# Patient Record
Sex: Female | Born: 1960 | Race: White | Hispanic: No | Marital: Married | Smoking: Never smoker
Health system: Southern US, Community
[De-identification: ages and names within clinical notes are randomized; demographics above are authoritative.]

## PROBLEM LIST (undated history)

## (undated) DIAGNOSIS — I10 Essential (primary) hypertension: Secondary | ICD-10-CM

## (undated) DIAGNOSIS — R3129 Other microscopic hematuria: Secondary | ICD-10-CM

## (undated) DIAGNOSIS — I6529 Occlusion and stenosis of unspecified carotid artery: Secondary | ICD-10-CM

## (undated) DIAGNOSIS — M8580 Other specified disorders of bone density and structure, unspecified site: Secondary | ICD-10-CM

## (undated) HISTORY — DX: Other microscopic hematuria: R31.29

## (undated) HISTORY — PX: DILATION AND CURETTAGE OF UTERUS: SHX78

## (undated) HISTORY — DX: Essential (primary) hypertension: I10

## (undated) HISTORY — DX: Occlusion and stenosis of unspecified carotid artery: I65.29

---
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
## 1997-05-22 ENCOUNTER — Ambulatory Visit (HOSPITAL_COMMUNITY): Admission: RE | Admit: 1997-05-22 | Discharge: 1997-05-22 | Payer: Self-pay | Admitting: Obstetrics and Gynecology

## 1998-02-12 ENCOUNTER — Encounter: Admission: RE | Admit: 1998-02-12 | Discharge: 1998-05-13 | Payer: Self-pay | Admitting: Gynecology

## 1998-04-12 ENCOUNTER — Observation Stay (HOSPITAL_COMMUNITY): Admission: AD | Admit: 1998-04-12 | Discharge: 1998-04-13 | Payer: Self-pay | Admitting: Gynecology

## 1998-04-24 ENCOUNTER — Inpatient Hospital Stay (HOSPITAL_COMMUNITY): Admission: AD | Admit: 1998-04-24 | Discharge: 1998-04-24 | Payer: Self-pay | Admitting: Obstetrics and Gynecology

## 1998-04-29 ENCOUNTER — Inpatient Hospital Stay (HOSPITAL_COMMUNITY): Admission: AD | Admit: 1998-04-29 | Discharge: 1998-05-02 | Payer: Self-pay | Admitting: Gynecology

## 1998-06-08 ENCOUNTER — Ambulatory Visit (HOSPITAL_COMMUNITY): Admission: RE | Admit: 1998-06-08 | Discharge: 1998-06-08 | Payer: Self-pay | Admitting: Family Medicine

## 1998-06-08 ENCOUNTER — Encounter: Payer: Self-pay | Admitting: Family Medicine

## 1998-06-11 ENCOUNTER — Other Ambulatory Visit: Admission: RE | Admit: 1998-06-11 | Discharge: 1998-06-11 | Payer: Self-pay | Admitting: Gynecology

## 1999-06-14 ENCOUNTER — Encounter: Payer: Self-pay | Admitting: Family Medicine

## 1999-06-14 ENCOUNTER — Ambulatory Visit (HOSPITAL_COMMUNITY): Admission: RE | Admit: 1999-06-14 | Discharge: 1999-06-14 | Payer: Self-pay | Admitting: Family Medicine

## 1999-07-13 ENCOUNTER — Other Ambulatory Visit: Admission: RE | Admit: 1999-07-13 | Discharge: 1999-07-13 | Payer: Self-pay | Admitting: Family Medicine

## 1999-12-30 ENCOUNTER — Other Ambulatory Visit: Admission: RE | Admit: 1999-12-30 | Discharge: 1999-12-30 | Payer: Self-pay | Admitting: Gynecology

## 2000-06-14 ENCOUNTER — Encounter: Payer: Self-pay | Admitting: Family Medicine

## 2000-06-14 ENCOUNTER — Ambulatory Visit (HOSPITAL_COMMUNITY): Admission: RE | Admit: 2000-06-14 | Discharge: 2000-06-14 | Payer: Self-pay | Admitting: Family Medicine

## 2000-10-03 ENCOUNTER — Other Ambulatory Visit: Admission: RE | Admit: 2000-10-03 | Discharge: 2000-10-03 | Payer: Self-pay | Admitting: *Deleted

## 2001-02-15 ENCOUNTER — Ambulatory Visit (HOSPITAL_COMMUNITY): Admission: RE | Admit: 2001-02-15 | Discharge: 2001-02-15 | Payer: Self-pay | Admitting: Gastroenterology

## 2001-05-23 ENCOUNTER — Other Ambulatory Visit: Admission: RE | Admit: 2001-05-23 | Discharge: 2001-05-23 | Payer: Self-pay | Admitting: Gynecology

## 2001-06-18 ENCOUNTER — Ambulatory Visit (HOSPITAL_COMMUNITY): Admission: RE | Admit: 2001-06-18 | Discharge: 2001-06-18 | Payer: Self-pay | Admitting: Family Medicine

## 2001-06-18 ENCOUNTER — Encounter: Payer: Self-pay | Admitting: Family Medicine

## 2001-06-20 ENCOUNTER — Encounter: Payer: Self-pay | Admitting: Family Medicine

## 2001-06-20 ENCOUNTER — Encounter: Admission: RE | Admit: 2001-06-20 | Discharge: 2001-06-20 | Payer: Self-pay | Admitting: Family Medicine

## 2002-06-30 ENCOUNTER — Encounter: Payer: Self-pay | Admitting: Family Medicine

## 2002-06-30 ENCOUNTER — Encounter: Admission: RE | Admit: 2002-06-30 | Discharge: 2002-06-30 | Payer: Self-pay | Admitting: Family Medicine

## 2002-12-14 ENCOUNTER — Inpatient Hospital Stay (HOSPITAL_COMMUNITY): Admission: AD | Admit: 2002-12-14 | Discharge: 2002-12-14 | Payer: Self-pay | Admitting: Gynecology

## 2002-12-14 ENCOUNTER — Encounter (INDEPENDENT_AMBULATORY_CARE_PROVIDER_SITE_OTHER): Payer: Self-pay | Admitting: *Deleted

## 2003-01-14 ENCOUNTER — Encounter: Admission: RE | Admit: 2003-01-14 | Discharge: 2003-01-14 | Payer: Self-pay | Admitting: Family Medicine

## 2003-06-15 ENCOUNTER — Inpatient Hospital Stay (HOSPITAL_COMMUNITY): Admission: AD | Admit: 2003-06-15 | Discharge: 2003-06-15 | Payer: Self-pay | Admitting: Gynecology

## 2003-09-11 ENCOUNTER — Other Ambulatory Visit: Admission: RE | Admit: 2003-09-11 | Discharge: 2003-09-11 | Payer: Self-pay | Admitting: Family Medicine

## 2003-10-08 ENCOUNTER — Encounter: Admission: RE | Admit: 2003-10-08 | Discharge: 2003-10-08 | Payer: Self-pay | Admitting: Family Medicine

## 2004-06-16 ENCOUNTER — Other Ambulatory Visit: Admission: RE | Admit: 2004-06-16 | Discharge: 2004-06-16 | Payer: Self-pay | Admitting: Gynecology

## 2004-09-22 ENCOUNTER — Other Ambulatory Visit: Admission: RE | Admit: 2004-09-22 | Discharge: 2004-09-22 | Payer: Self-pay | Admitting: Gynecology

## 2004-10-11 ENCOUNTER — Encounter: Admission: RE | Admit: 2004-10-11 | Discharge: 2004-10-11 | Payer: Self-pay | Admitting: Gynecology

## 2005-04-06 ENCOUNTER — Encounter: Admission: RE | Admit: 2005-04-06 | Discharge: 2005-04-06 | Payer: Self-pay | Admitting: Family Medicine

## 2005-06-23 ENCOUNTER — Other Ambulatory Visit: Admission: RE | Admit: 2005-06-23 | Discharge: 2005-06-23 | Payer: Self-pay | Admitting: Gynecology

## 2005-10-31 ENCOUNTER — Encounter: Admission: RE | Admit: 2005-10-31 | Discharge: 2005-10-31 | Payer: Self-pay | Admitting: Family Medicine

## 2005-11-07 ENCOUNTER — Encounter: Admission: RE | Admit: 2005-11-07 | Discharge: 2005-11-07 | Payer: Self-pay | Admitting: Family Medicine

## 2006-01-30 DIAGNOSIS — I10 Essential (primary) hypertension: Secondary | ICD-10-CM

## 2006-01-30 HISTORY — DX: Essential (primary) hypertension: I10

## 2006-07-12 ENCOUNTER — Other Ambulatory Visit: Admission: RE | Admit: 2006-07-12 | Discharge: 2006-07-12 | Payer: Self-pay | Admitting: Gynecology

## 2006-11-13 ENCOUNTER — Encounter: Admission: RE | Admit: 2006-11-13 | Discharge: 2006-11-13 | Payer: Self-pay | Admitting: Family Medicine

## 2007-07-25 ENCOUNTER — Other Ambulatory Visit: Admission: RE | Admit: 2007-07-25 | Discharge: 2007-07-25 | Payer: Self-pay | Admitting: Gynecology

## 2007-12-10 ENCOUNTER — Encounter: Admission: RE | Admit: 2007-12-10 | Discharge: 2007-12-10 | Payer: Self-pay | Admitting: Family Medicine

## 2008-02-14 ENCOUNTER — Ambulatory Visit: Payer: Self-pay | Admitting: Internal Medicine

## 2008-02-14 DIAGNOSIS — I10 Essential (primary) hypertension: Secondary | ICD-10-CM | POA: Insufficient documentation

## 2008-02-14 DIAGNOSIS — R42 Dizziness and giddiness: Secondary | ICD-10-CM | POA: Insufficient documentation

## 2008-03-09 ENCOUNTER — Ambulatory Visit: Payer: Self-pay | Admitting: Internal Medicine

## 2008-07-27 ENCOUNTER — Other Ambulatory Visit: Admission: RE | Admit: 2008-07-27 | Discharge: 2008-07-27 | Payer: Self-pay | Admitting: Gynecology

## 2008-07-27 ENCOUNTER — Encounter: Payer: Self-pay | Admitting: Women's Health

## 2008-07-27 ENCOUNTER — Ambulatory Visit: Payer: Self-pay | Admitting: Women's Health

## 2008-08-13 DIAGNOSIS — O99810 Abnormal glucose complicating pregnancy: Secondary | ICD-10-CM | POA: Insufficient documentation

## 2008-09-21 ENCOUNTER — Ambulatory Visit: Payer: Self-pay | Admitting: Internal Medicine

## 2008-10-15 ENCOUNTER — Encounter: Payer: Self-pay | Admitting: Internal Medicine

## 2008-10-22 ENCOUNTER — Ambulatory Visit: Payer: Self-pay | Admitting: Internal Medicine

## 2008-10-22 DIAGNOSIS — R319 Hematuria, unspecified: Secondary | ICD-10-CM | POA: Insufficient documentation

## 2008-11-04 ENCOUNTER — Telehealth (INDEPENDENT_AMBULATORY_CARE_PROVIDER_SITE_OTHER): Payer: Self-pay | Admitting: *Deleted

## 2008-11-12 ENCOUNTER — Telehealth (INDEPENDENT_AMBULATORY_CARE_PROVIDER_SITE_OTHER): Payer: Self-pay | Admitting: *Deleted

## 2008-12-30 ENCOUNTER — Encounter: Admission: RE | Admit: 2008-12-30 | Discharge: 2008-12-30 | Payer: Self-pay | Admitting: Family Medicine

## 2009-02-18 ENCOUNTER — Ambulatory Visit: Payer: Self-pay | Admitting: Women's Health

## 2009-02-20 ENCOUNTER — Ambulatory Visit: Payer: Self-pay | Admitting: Family Medicine

## 2009-02-20 DIAGNOSIS — R5381 Other malaise: Secondary | ICD-10-CM | POA: Insufficient documentation

## 2009-02-20 DIAGNOSIS — R5383 Other fatigue: Secondary | ICD-10-CM

## 2009-02-21 LAB — CONVERTED CEMR LAB: TSH: 1.356 microintl units/mL (ref 0.350–4.500)

## 2009-08-04 ENCOUNTER — Ambulatory Visit: Payer: Self-pay | Admitting: Women's Health

## 2009-08-04 ENCOUNTER — Other Ambulatory Visit: Admission: RE | Admit: 2009-08-04 | Discharge: 2009-08-04 | Payer: Self-pay | Admitting: Obstetrics and Gynecology

## 2009-09-23 ENCOUNTER — Ambulatory Visit: Payer: Self-pay | Admitting: Cardiology

## 2009-12-31 ENCOUNTER — Encounter: Admission: RE | Admit: 2009-12-31 | Discharge: 2009-12-31 | Payer: Self-pay | Admitting: Gynecology

## 2010-02-20 ENCOUNTER — Encounter: Payer: Self-pay | Admitting: Family Medicine

## 2010-02-27 LAB — CONVERTED CEMR LAB
BUN: 14 mg/dL (ref 6–23)
Basophils Absolute: 0 10*3/uL (ref 0.0–0.1)
Basophils Relative: 0.6 % (ref 0.0–3.0)
Bilirubin Urine: NEGATIVE
CO2: 33 meq/L — ABNORMAL HIGH (ref 19–32)
Calcium: 9.7 mg/dL (ref 8.4–10.5)
Chloride: 103 meq/L (ref 96–112)
Creatinine, Ser: 0.9 mg/dL (ref 0.4–1.2)
Eosinophils Absolute: 0.1 10*3/uL (ref 0.0–0.7)
Eosinophils Relative: 2.1 % (ref 0.0–5.0)
GFR calc non Af Amer: 70.52 mL/min (ref >60)
Glucose, Bld: 93 mg/dL (ref 70–99)
HCT: 36.6 % (ref 36.0–46.0)
Hemoglobin: 12.4 g/dL (ref 12.0–15.0)
Ketones, ur: NEGATIVE mg/dL
Lymphocytes Relative: 34.8 % (ref 12.0–46.0)
Lymphs Abs: 2.4 10*3/uL (ref 0.7–4.0)
MCHC: 33.9 g/dL (ref 30.0–36.0)
MCV: 91.5 fL (ref 78.0–100.0)
Monocytes Absolute: 0.5 10*3/uL (ref 0.1–1.0)
Monocytes Relative: 6.8 % (ref 3.0–12.0)
Neutro Abs: 3.9 10*3/uL (ref 1.4–7.7)
Neutrophils Relative %: 55.7 % (ref 43.0–77.0)
Nitrite: NEGATIVE
Platelets: 179 10*3/uL (ref 150.0–400.0)
Potassium: 3.7 meq/L (ref 3.5–5.1)
RBC: 4 M/uL (ref 3.87–5.11)
RDW: 12.2 % (ref 11.5–14.6)
Sodium: 141 meq/L (ref 135–145)
Specific Gravity, Urine: <=1.005 (ref 1.000–1.030)
TSH: 1.13 microintl units/mL (ref 0.35–5.50)
Total Protein, Urine: NEGATIVE mg/dL
Urine Glucose: NEGATIVE mg/dL
Urobilinogen, UA: 0.2 (ref 0.0–1.0)
WBC: 6.9 10*3/uL (ref 4.5–10.5)
pH: 7 (ref 5.0–8.0)

## 2010-03-01 NOTE — Assessment & Plan Note (Signed)
Summary: Inc BP, Shaky, nausea, dixxy x 1 mth rm 2   Vital Signs:  Patient Profile:   50 Years Old Female CC:       Inc BP, nausea, dizzy, shaky x 2-3 dys Height:     66 inches Weight:      134 pounds O2 Sat:      100 % O2 treatment:    Room Air Temp:     97.1 degrees F oral Pulse rate:   72 / minute Pulse (ortho):   87 / minute Pulse rhythm:   regular Resp:     16 per minute BP sitting:   118 / 84  (left arm) BP standing:   113 / 77 Cuff size:   regular  Vitals Entered By: Areta Haber CMA (February 20, 2009 1:03 PM)                 Serial Vital Signs/Assessments:  Time      Position  BP       Pulse  Resp  Temp     By           Lying RA  122/76   73                    [facility] CMA           Sitting   120/79   78                    Areta Haber CMA           Standing  113/77   87                    Areta Haber CMA   Current Allergies: ! CODEINE     History of Present Illness Chief Complaint:  Inc BP, nausea, dizzy, shaky x 2-3 dys History of Present Illness: Subjective:  Patient complains of one week history of vague recurring dizziness with movement, fatigue,  and not feeling quite well.  She has had occasional brief nausea without vomiting.  No abdominal pain.  She has had increased constipation for about 3 weeks.  At bedtime, her chest feels somewhat tight but not painful, and she sometimes feels that her heart is beating more rapidly at bedtime.  She has no chest symptoms with activity.  No shortness of breath.   No fevers, chills, and sweats.  Menstrual periods are short and irregular. She checked her BP this morning and recorded 96/65, although it is normally higher than that. She has had negative screening colonoscopy. Recent CBC and CMP by her cardiologist were normal.  Current Problems: FATIGUE (ICD-780.79) HEMATURIA UNSPECIFIED (ICD-599.70) PRE-ECLAMPSIA, HX OF (ICD-642.40) GESTATIONAL DIABETES (ICD-648.80) HYPERTENSION, BENIGN  (ICD-401.1) DIZZINESS (ICD-780.4)   Current Meds ASPIRIN 81 MG  TABS (ASPIRIN) 1 tab once daily MULTIVITAMINS   TABS (MULTIPLE VITAMIN) 1 tab once daily FISH OIL   OIL (FISH OIL) 2 tabs once daily VITAMIN D 1000 UNIT  TABS (CHOLECALCIFEROL) 2 tabs once daily MAGNESIUM 500 MG TABS (MAGNESIUM) otc 1 tab once daily PINDOLOL 5 MG TABS (PINDOLOL) 1 tab two times a day LISINOPRIL 10 MG TABS (LISINOPRIL) 1 tab by mouth once daily  REVIEW OF SYSTEMS Constitutional Symptoms      Denies fever, chills, night sweats, weight loss, weight gain, and fatigue.  Eyes       Denies change in vision, eye pain, eye discharge, glasses, contact lenses, and eye surgery. Ear/Nose/Throat/Mouth  Denies hearing loss/aids, change in hearing, ear pain, ear discharge, dizziness, frequent runny nose, frequent nose bleeds, sinus problems, sore throat, hoarseness, and tooth pain or bleeding.  Respiratory       Denies dry cough, productive cough, wheezing, shortness of breath, asthma, bronchitis, and emphysema/COPD.  Cardiovascular       Denies murmurs, chest pain, and tires easily with exhertion.    Gastrointestinal       Complains of nausea/vomiting.      Denies stomach pain, diarrhea, constipation, blood in bowel movements, and indigestion. Genitourniary       Denies painful urination, kidney stones, and loss of urinary control. Neurological       Denies paralysis, seizures, and fainting/blackouts.      Comments: dizzy Musculoskeletal       Denies muscle pain, joint pain, joint stiffness, decreased range of motion, redness, swelling, muscle weakness, and gout.  Skin       Denies bruising, unusual mles/lumps or sores, and hair/skin or nail changes.  Psych       Denies mood changes, temper/anger issues, anxiety/stress, speech problems, depression, and sleep problems. Other Comments: pt states that she has been having symptoms for past month. Pt states that she has advise PCP of symptoms.    Past  History:  Past Medical History: Last updated: 08/13/2008 PRE-ECLAMPSIA, HX OF (ICD-642.40) GESTATIONAL DIABETES (ICD-648.80) HYPERTENSION, BENIGN (ICD-401.1) DIZZINESS (ICD-780.4)    Past Surgical History: Last updated: 02/14/2008 Bx of skin lesions  Family History: Last updated: 02/14/2008 Dad, MI in 6s (pipe smoker) Sister MI 29 (stent; smoker/obese) Mom  HTN, TIA Colon Ca grandmother  Social History: Last updated: 02/14/2008 Married. 2kids No tobacco 1-2 glasses wine/night   Objective:  Appearance:  Patient appears healthy, stated age, and in no acute distress  Eyes:  Pupils are equal, round, and reactive to light and accomdation.  Extraocular movement is intact.  Conjunctivae are not inflamed.  Ears:  Canals normal.  Tympanic membranes normal.   Nose:  Normal Pharynx:  Normal  Neck:  Supple.  No adenopathy is present.  No thyromegaly is present.  Carotids have normal upstrokes Lungs:  Clear to auscultation.  Breath sounds are equal.  Heart:  Regular rate and rhythm without murmurs, rubs, or gallops.  Rate 80 Abdomen:  Nontender without masses or hepatosplenomegaly.  Bowel sounds are present.  No CVA or flank tenderness.  Extremities:  No edema.  Pedal pulses are full and equal.  Measurements of standing, sitting, supine BP and pulse show no abnormal orthostatic changes Assessment New Problems: FATIGUE (ICD-780.79)  UNREMARKABLE PHYSICAL EXAM TODAY.  ? ANXIETY   With recent fatigue and constipation, rule out hypothyroid  Plan New Orders: T-TSH [[id number]] New Patient Level III [99203] Planning Comments:   Check TSH.   Follow-up with PCP   The patient and/or caregiver has been counseled thoroughly with regard to medications prescribed including dosage, schedule, interactions, rationale for use, and possible side effects and they verbalize understanding.  Diagnoses and expected course of recovery discussed and will return if not improved as expected or if  the condition worsens. Patient and/or caregiver verbalized understanding.

## 2010-03-15 ENCOUNTER — Ambulatory Visit (INDEPENDENT_AMBULATORY_CARE_PROVIDER_SITE_OTHER): Payer: BC Managed Care – PPO | Admitting: *Deleted

## 2010-03-15 DIAGNOSIS — I10 Essential (primary) hypertension: Secondary | ICD-10-CM

## 2010-04-05 ENCOUNTER — Ambulatory Visit: Payer: BC Managed Care – PPO | Admitting: Nurse Practitioner

## 2010-06-14 NOTE — Assessment & Plan Note (Signed)
[facility]                            CARDIOLOGY OFFICE NOTE   NAME:Studer, NARDOS PUTNAM                        MRN:          [id number]  DATE:03/09/2008                            DOB:          January 02, 1961    IDENTIFICATION:  Ms. Rovira is a 50 year old who I saw back on February 14, 2008.  She has a history of hypertension and dizziness.  When I saw  her last, I though she had some evidence of orthostasis on examination  consistent with autonomic dysfunction.  I switched her medicines and put  her on pindolol 5 mg b.i.d.   On talking to the patient, she said the first couple weeks were rough.  She felt tired, little shaky, but since then has gotten better.  Her  blood pressures at home have been in the 115-160 range.  Pulses have  been recorded in the 70s.   CURRENT MEDICINES:  1. Calcium 600 daily.  2. Fish oil 1 g daily.  3. Multivitamin.  4. Vitamin D.  5. Pindolol 5 b.i.d.   PHYSICAL EXAMINATION:  GENERAL:  The patient in no acute distress.  VITAL SIGNS:  Blood pressure on arrival 116/70; lying 122/81, pulse 76;  sitting 126/77, pulse 76, slightly lightheaded; standing at 0 minutes,  125/75, pulse 85, slightly dizzy; at 2 minutes, 139/88, pulse 80, at 5  minutes, 130/86, pulse 79.  The patient feeling better.  LUNGS:  Clear.  CARDIAC:  Regular rate and rhythm.  S1, S2.  No S3, no murmurs.  ABDOMEN:  Benign.  EXTREMITIES:  No edema.   IMPRESSION:  1. Blood pressure improved.  No evidence of peripheral pulling.  Blood      pressure staying fairly steady.  I would continue her on this      regimen.  I will set to see her in July, talked her about staying      active, eating frequent small meals, having salt in moderation, and      following her blood pressure 1-2 times per week.  If she has any      problems, she can call, otherwise I will set to see her as noted.  2. Health care maintenance.  Again, we will need to look back at her  lipids.  She has a strong family history with her father and      sister.  It may indeed be worthwhile getting a LipoMed panel to      evaluate her particle number.  It looks like her last LDL was 119.      HDL looked greater than 76.     Pricilla Riffle, MD, [facility]  Electronically Signed   PVR/MedQ  DD: 03/09/2008  DT: 03/10/2008  Job #: [id number]

## 2010-06-14 NOTE — Assessment & Plan Note (Signed)
[facility]                            CARDIOLOGY OFFICE NOTE   NAME:Oloughlin, DAJON ROWE                        MRN:          [id number]  DATE:02/14/2008                            DOB:          10-Feb-1960    IDENTIFICATION:  Ms. Hefner is a 50 year old woman who was referred from  the [name] for evaluation of hypertension and dizziness.   HISTORY OF PRESENT ILLNESS:  On talking to the patient today, she had  preeclampsia with her pregnancy back 9 years ago.  Since that time, she  has had blood pressures that were noted to be high in clinic visits and  felt to be white coat.  She does not remember the values.   She was seen in the [facility] on January 21, 2008, and her blood  pressure was quite elevated at 172/110.  She was given clonidine orally  and sent home with Benicar 20 daily.  At home since that time on the  Benicar, blood pressures have ranged from 130s-150s/70s-90s.   The patient does note that she has had spells of dizziness.  The spells  even came on before pregnancy.  She also has a history of hypoglycemia  that was documented.  She denies syncope.  The spells can come and go.  She says she feels the best when she has been up for a walk, otherwise  after a walk her head feels clear.  She feels right otherwise.  It just  is in right.  She feels a little foggy like she just woke up.  She tries  to walk 3 times a day, even run a little.   She denies discomfort in her chest.  No shortness of breath.   ALLERGIES:  CODEINE.   CURRENT MEDICATIONS:  1. Benicar 20 mg at bedtime.  2. Calcium daily 600.  3. Fish oil 1 g daily.  4. Multivitamin daily.  5. Vitamin D daily 1000 units.   PAST MEDICAL HISTORY:  Hypertension, history of preeclampsia.   SOCIAL HISTORY:  The patient is married, stays at home, does not smoke,  drinks 1-2 glasses of wine per day.   FAMILY HISTORY:  Mother had transient ischemic attack in her 69s.  Father had  an MI in his 18s, smokes pipes, one sister had an MI with a  stent placed at age 52 (obese and a smoker).  Hypertension in her mom,  colon cancer in her grandmother.   REVIEW OF SYSTEMS:  All systems reviewed negative to the above problem  except as noted above.  Does note some constipation.   PHYSICAL EXAMINATION:  GENERAL:  On exam, the patient is in no acute  distress.  VITAL SIGNS:  Blood pressure on arrival 154/94, pulse 70, lying blood  pressure 147/89, pulse 73.  The patient asymptomatic, sitting 153/88,  pulse 76, asymptomatic, standing at 0 minutes is 145/97, pulse 84,  asymptomatic; at 2 minutes, 154/98, pulse 92, asymptomatic; and at 5  minutes, 124/95, pulse 100, asymptomatic.  HEENT:  Normocephalic, atraumatic, EOMI, PERL.  Mucous membranes are  moist.  NECK:  JVP is normal.  No thyromegaly or bruits.  LUNGS:  Clear without rales or wheezes.  CARDIAC:  Regular rate and rhythm, S1 and S2.  No S3, S4, or murmurs.  No clicks.  PMI not displaced.  ABDOMEN:  Supple, nontender.  Normal bowel sounds.  No hepatomegaly.  No  masses.  EXTREMITIES:  Good distal pulses throughout, equal onset throughout.  No  lower extremity edema.   A 12-lead EKG here shows normal sinus rhythm 71 beats per minute.  Nonspecific IVCD.   IMPRESSION:  It is interesting Ms. Delaluz has both evidence of  hypertension and some orthostatic intolerance, which may indeed explain  her dizziness, feeling foggy, may even explain some of the posterior  achiness in her neck.   What I would recommend is that we switch her to pindolol 5 mg b.i.d.  She should keep the Benicar and I have given her some samples of the  Avapro for pressure still high greater than 160/100.  She can take half  of this.  I would like to see her back in 4 weeks to reevaluate.   Again, staying active is important, watching to make sure she gets  adequate fluid intake with noncaffeinated drinks (she only drinks 2 cups  per coffee early  in the morning).  Small meals throughout the day would  also be important.   Health care maintenance.  Last lipid panel done was in 2008.  LDL was  119, HDL was very good at looks like 76 at least (fax is hard to read).  With her family history, I think consideration for a LipoMed panel to  really see what her particle numbers look like since both her father and  sister had artery disease.   I will be in touch with the patient.  I will see the patient in 1 month.  She will call if she has worsening symptoms.     Pricilla Riffle, MD, [facility]  Electronically Signed    PVR/MedQ  DD: 02/14/2008  DT: 02/15/2008  Job #: [id number]

## 2010-06-17 NOTE — Op Note (Signed)
[facility]  Patient:    Brittany Graham, Brittany Graham Visit Number: [id number] MRN: [id number]          Service Type: END Location: ENDO Attending Physician:  Rich Brave Dictated by:   Florencia Reasons, M.D. Proc. Date: 02/15/01 Admit Date:  02/15/2001 Discharge Date: 02/15/2001   CC:         Stacie Acres. Cliffton Asters, M.D.   Operative Report  PROCEDURE:  Colonoscopy.  INDICATION:  Family history of colon cancer in her maternal grandmother and colon polyps on her mothers side of the family.  FINDINGS:  Normal exam to the cecum.  DESCRIPTION OF PROCEDURE:  The nature, purpose, and risks of the procedure were familiar to the patient from prior examination. She provided written consent.  SEDATION:  Fentanyl 100 mcg and Versed 12 mg IV without arrhythmias or desaturation.  The Olympus adjustable tension pediatric video colonoscope was used for this procedure but we encountered moderate difficulty with looping, suggesting that in the future, we would do better to use an adult scope. With the patient in the _______ position and some external abdominal compression we were able to reach the base of the cecum. Pullback was then performed. The quality of the prep was excellent and it was felt that all areas were well seen.  This was a normal examination. No polyps, cancer, colitis, vascular malformations or diverticulosis were noted. In retroflexion the rectum was normal. No biopsies were obtained. The patient tolerated the procedure well and there were no apparent complications.  IMPRESSION:  Normal colonoscopy.  PLAN:  Followup colonoscopy in five years in view of the family history. Dictated by:   Florencia Reasons, M.D. Attending Physician:  Rich Brave DD:  02/15/01 TD:  02/17/01 Job: [id number]

## 2010-07-09 ENCOUNTER — Emergency Department (HOSPITAL_BASED_OUTPATIENT_CLINIC_OR_DEPARTMENT_OTHER)
Admission: EM | Admit: 2010-07-09 | Discharge: 2010-07-10 | Disposition: A | Discharge status: Home / Self Care | Payer: BC Managed Care – PPO | Attending: Emergency Medicine | Admitting: Emergency Medicine

## 2010-07-09 DIAGNOSIS — I491 Atrial premature depolarization: Secondary | ICD-10-CM | POA: Insufficient documentation

## 2010-07-09 DIAGNOSIS — R002 Palpitations: Secondary | ICD-10-CM | POA: Insufficient documentation

## 2010-07-09 DIAGNOSIS — I10 Essential (primary) hypertension: Secondary | ICD-10-CM | POA: Insufficient documentation

## 2010-08-08 ENCOUNTER — Other Ambulatory Visit (HOSPITAL_COMMUNITY)
Admission: RE | Admit: 2010-08-08 | Discharge: 2010-08-08 | Disposition: A | Discharge status: Home / Self Care | Payer: BC Managed Care – PPO | Source: Ambulatory Visit | Attending: Gynecology | Admitting: Gynecology

## 2010-08-08 ENCOUNTER — Other Ambulatory Visit: Payer: Self-pay | Admitting: Women's Health

## 2010-08-08 ENCOUNTER — Encounter (INDEPENDENT_AMBULATORY_CARE_PROVIDER_SITE_OTHER): Payer: BC Managed Care – PPO | Admitting: Women's Health

## 2010-08-08 DIAGNOSIS — Z01419 Encounter for gynecological examination (general) (routine) without abnormal findings: Secondary | ICD-10-CM

## 2010-08-08 DIAGNOSIS — Z124 Encounter for screening for malignant neoplasm of cervix: Secondary | ICD-10-CM | POA: Insufficient documentation

## 2010-08-08 DIAGNOSIS — Z833 Family history of diabetes mellitus: Secondary | ICD-10-CM

## 2010-08-08 DIAGNOSIS — Z13220 Encounter for screening for lipoid disorders: Secondary | ICD-10-CM

## 2010-11-30 ENCOUNTER — Other Ambulatory Visit: Payer: Self-pay | Admitting: Family Medicine

## 2010-11-30 DIAGNOSIS — Z1231 Encounter for screening mammogram for malignant neoplasm of breast: Secondary | ICD-10-CM

## 2010-12-07 ENCOUNTER — Ambulatory Visit (INDEPENDENT_AMBULATORY_CARE_PROVIDER_SITE_OTHER): Payer: BC Managed Care – PPO | Admitting: Women's Health

## 2010-12-07 ENCOUNTER — Encounter: Payer: Self-pay | Admitting: Women's Health

## 2010-12-07 VITALS — BP 130/80

## 2010-12-07 DIAGNOSIS — N912 Amenorrhea, unspecified: Secondary | ICD-10-CM

## 2010-12-07 NOTE — Progress Notes (Signed)
  Presents with complaint of not feeling as well as she has in the past. States feels somewhat jittery, not as clear thinking , occasional hot flushes, amenorrheic since August, monthly cycle prior. Hypertensive on medications per cardiologist. Has been hypertensive since 08, healthy lifestyle with exercise, healthy diet and stable weight. BP today 130/80.   Appears well. Menopause reviewed. Will check TSH and FSH, triage based on results. HRT discussed, and WHI study reviewed. Declines meds at this time.

## 2011-01-06 ENCOUNTER — Ambulatory Visit: Payer: BC Managed Care – PPO

## 2011-01-13 ENCOUNTER — Ambulatory Visit
Admission: RE | Admit: 2011-01-13 | Discharge: 2011-01-13 | Disposition: A | Discharge status: Home / Self Care | Payer: BC Managed Care – PPO | Source: Ambulatory Visit | Attending: Family Medicine | Admitting: Family Medicine

## 2011-01-13 DIAGNOSIS — Z1231 Encounter for screening mammogram for malignant neoplasm of breast: Secondary | ICD-10-CM

## 2011-08-16 ENCOUNTER — Encounter: Payer: Self-pay | Admitting: Women's Health

## 2011-08-16 ENCOUNTER — Ambulatory Visit (INDEPENDENT_AMBULATORY_CARE_PROVIDER_SITE_OTHER): Payer: BC Managed Care – PPO | Admitting: Women's Health

## 2011-08-16 VITALS — BP 118/76 | Ht 66.0 in | Wt 133.0 lb

## 2011-08-16 DIAGNOSIS — E079 Disorder of thyroid, unspecified: Secondary | ICD-10-CM

## 2011-08-16 DIAGNOSIS — Z13220 Encounter for screening for lipoid disorders: Secondary | ICD-10-CM

## 2011-08-16 DIAGNOSIS — Z01419 Encounter for gynecological examination (general) (routine) without abnormal findings: Secondary | ICD-10-CM

## 2011-08-16 DIAGNOSIS — Z780 Asymptomatic menopausal state: Secondary | ICD-10-CM

## 2011-08-16 LAB — CBC WITH DIFFERENTIAL/PLATELET
Eosinophils Relative: 2 % (ref 0–5)
HCT: 39.2 % (ref 36.0–46.0)
Hemoglobin: 13.1 g/dL (ref 12.0–15.0)
Lymphocytes Relative: 37 % (ref 12–46)
Lymphs Abs: 1.8 10*3/uL (ref 0.7–4.0)
MCV: 88.7 fL (ref 78.0–100.0)
Monocytes Absolute: 0.4 10*3/uL (ref 0.1–1.0)
Monocytes Relative: 7 % (ref 3–12)
Neutro Abs: 2.6 10*3/uL (ref 1.7–7.7)
RBC: 4.42 MIL/uL (ref 3.87–5.11)
WBC: 4.9 10*3/uL (ref 4.0–10.5)

## 2011-08-16 LAB — COMPREHENSIVE METABOLIC PANEL
ALT: 10 U/L (ref 0–35)
AST: 21 U/L (ref 0–37)
Albumin: 4.4 g/dL (ref 3.5–5.2)
Alkaline Phosphatase: 39 U/L (ref 39–117)
BUN: 10 mg/dL (ref 6–23)
CO2: 30 mEq/L (ref 19–32)
Calcium: 9.6 mg/dL (ref 8.4–10.5)
Chloride: 102 mEq/L (ref 96–112)
Creat: 0.8 mg/dL (ref 0.50–1.10)
Glucose, Bld: 78 mg/dL (ref 70–99)
Potassium: 3.9 mEq/L (ref 3.5–5.3)
Sodium: 139 mEq/L (ref 135–145)
Total Bilirubin: 0.9 mg/dL (ref 0.3–1.2)
Total Protein: 6.8 g/dL (ref 6.0–8.3)

## 2011-08-16 LAB — LIPID PANEL
Cholesterol: 191 mg/dL (ref 0–200)
VLDL: 21 mg/dL (ref 0–40)

## 2011-08-16 NOTE — Progress Notes (Addendum)
Brittany Graham 01-31-1961 [id number]    History:    The patient presents for annual exam.  Monthly cycle for 2-3 days until September 2012, and then none until March  2012. History of normal Paps and mammograms. Saw primary care last week and requested labs to be done today and faxed to her office. History of hypertension (08) on bystolic and lisinopril. Negative colonoscopy in 08 and 2013. Maternal grandmother and 2 sisters with colon cancer history.  Past medical history, past surgical history, family history and social history were all reviewed and documented in the EPIC chart. Stay-at-home mom. Daughter Maralyn Sago 20, adopted daughter Corrie Dandy 6.   ROS:  A  ROS was performed and pertinent positives and negatives are included in the history.  Exam:  Filed Vitals:   08/16/11 0910  BP: 118/76    General appearance:  Normal Head/Neck:  Normal, without cervical or supraclavicular adenopathy. Thyroid:  Symmetrical, normal in size, without palpable masses or nodularity. Respiratory  Effort:  Normal  Auscultation:  Clear without wheezing or rhonchi Cardiovascular  Auscultation:  Regular rate, without rubs, murmurs or gallops  Edema/varicosities:  Not grossly evident Abdominal  Soft,nontender, without masses, guarding or rebound.  Liver/spleen:  No organomegaly noted  Hernia:  None appreciated  Skin  Inspection:  Grossly normal  Palpation:  Grossly normal Neurologic/psychiatric  Orientation:  Normal with appropriate conversation.  Mood/affect:  Normal  Genitourinary    Breasts: Examined lying and sitting.     Right: Without masses, retractions, discharge or axillary adenopathy.     Left: Without masses, retractions, discharge or axillary adenopathy.   Inguinal/mons:  Normal without inguinal adenopathy  External genitalia:  Normal  BUS/Urethra/Skene's glands:  Normal  Bladder:  Normal  Vagina:  Normal  Cervix:  Normal  Uterus:   normal in size, shape and contour.  Midline and  mobile  Adnexa/parametria:     Rt: Without masses or tenderness.   Lt: Without masses or tenderness.  Anus and perineum: Normal  Digital rectal exam: Normal sphincter tone without palpated masses or tenderness  Assessment/Plan:  51 y.o. MWF G5 P1 for annual exam with no complaints.  Perimenopause Hypertension-primary care meds  Plan: CBC, TSH, lipid panel, FSH, comprehensive metabolic panel and vitamin D. No Pap history of normal Paps. SBE's, continue annual mammogram, calcium rich diet, vitamin D 2000 daily encouraged. Bone density this year or next. Reviewed if FSH elevated - menopause. Denies problematic menopausal symptoms other than states is moody at times.   Harrington Challenger [facility], 4:41 PM 08/16/2011

## 2011-08-16 NOTE — Patient Instructions (Signed)

## 2011-08-17 LAB — URINALYSIS W MICROSCOPIC + REFLEX CULTURE
Glucose, UA: NEGATIVE mg/dL
Leukocytes, UA: NEGATIVE
Nitrite: NEGATIVE
Protein, ur: NEGATIVE mg/dL
Squamous Epithelial / LPF: NONE SEEN
Urobilinogen, UA: 0.2 mg/dL (ref 0.0–1.0)

## 2011-08-17 LAB — TSH: TSH: 1.375 u[IU]/mL (ref 0.350–4.500)

## 2011-08-17 LAB — VITAMIN D 25 HYDROXY (VIT D DEFICIENCY, FRACTURES): Vit D, 25-Hydroxy: 71 ng/mL (ref 30–89)

## 2011-12-18 ENCOUNTER — Other Ambulatory Visit: Payer: Self-pay | Admitting: Family Medicine

## 2011-12-18 DIAGNOSIS — Z1231 Encounter for screening mammogram for malignant neoplasm of breast: Secondary | ICD-10-CM

## 2012-02-15 ENCOUNTER — Ambulatory Visit
Admission: RE | Admit: 2012-02-15 | Discharge: 2012-02-15 | Disposition: A | Discharge status: Home / Self Care | Payer: BC Managed Care – PPO | Source: Ambulatory Visit | Attending: Family Medicine | Admitting: Family Medicine

## 2012-02-15 DIAGNOSIS — Z1231 Encounter for screening mammogram for malignant neoplasm of breast: Secondary | ICD-10-CM

## 2012-11-08 ENCOUNTER — Other Ambulatory Visit: Payer: Self-pay | Admitting: *Deleted

## 2012-11-08 DIAGNOSIS — R2231 Localized swelling, mass and lump, right upper limb: Secondary | ICD-10-CM

## 2012-11-11 ENCOUNTER — Other Ambulatory Visit: Payer: Self-pay | Admitting: *Deleted

## 2012-11-11 DIAGNOSIS — N6310 Unspecified lump in the right breast, unspecified quadrant: Secondary | ICD-10-CM

## 2012-11-22 ENCOUNTER — Other Ambulatory Visit: Payer: BC Managed Care – PPO

## 2012-11-22 ENCOUNTER — Ambulatory Visit
Admission: RE | Admit: 2012-11-22 | Discharge: 2012-11-22 | Disposition: A | Discharge status: Home / Self Care | Payer: BC Managed Care – PPO | Source: Ambulatory Visit | Attending: *Deleted | Admitting: *Deleted

## 2012-11-22 DIAGNOSIS — R2231 Localized swelling, mass and lump, right upper limb: Secondary | ICD-10-CM

## 2012-11-22 DIAGNOSIS — N6310 Unspecified lump in the right breast, unspecified quadrant: Secondary | ICD-10-CM

## 2013-01-21 ENCOUNTER — Other Ambulatory Visit: Payer: Self-pay

## 2013-01-21 DIAGNOSIS — Z1231 Encounter for screening mammogram for malignant neoplasm of breast: Secondary | ICD-10-CM

## 2013-02-20 ENCOUNTER — Ambulatory Visit
Admission: RE | Admit: 2013-02-20 | Discharge: 2013-02-20 | Disposition: A | Discharge status: Home / Self Care | Payer: BC Managed Care – PPO | Source: Ambulatory Visit

## 2013-02-20 DIAGNOSIS — Z1231 Encounter for screening mammogram for malignant neoplasm of breast: Secondary | ICD-10-CM

## 2013-11-11 ENCOUNTER — Encounter: Payer: Self-pay | Admitting: General Surgery

## 2013-12-01 ENCOUNTER — Encounter: Payer: Self-pay | Admitting: General Surgery

## 2014-03-04 ENCOUNTER — Other Ambulatory Visit: Payer: Self-pay

## 2014-03-04 DIAGNOSIS — Z1231 Encounter for screening mammogram for malignant neoplasm of breast: Secondary | ICD-10-CM

## 2014-03-18 ENCOUNTER — Encounter (INDEPENDENT_AMBULATORY_CARE_PROVIDER_SITE_OTHER): Payer: Self-pay

## 2014-03-18 ENCOUNTER — Ambulatory Visit
Admission: RE | Admit: 2014-03-18 | Discharge: 2014-03-18 | Disposition: A | Discharge status: Home / Self Care | Payer: BLUE CROSS/BLUE SHIELD | Source: Ambulatory Visit

## 2014-03-18 DIAGNOSIS — Z1231 Encounter for screening mammogram for malignant neoplasm of breast: Secondary | ICD-10-CM

## 2014-04-15 ENCOUNTER — Ambulatory Visit: Payer: Self-pay | Admitting: Women's Health

## 2014-04-16 ENCOUNTER — Ambulatory Visit: Payer: Self-pay | Admitting: Women's Health

## 2014-04-29 ENCOUNTER — Ambulatory Visit (INDEPENDENT_AMBULATORY_CARE_PROVIDER_SITE_OTHER): Payer: BLUE CROSS/BLUE SHIELD | Admitting: Women's Health

## 2014-04-29 ENCOUNTER — Encounter: Payer: Self-pay | Admitting: Women's Health

## 2014-04-29 VITALS — BP 130/88 | Ht 66.0 in | Wt 132.0 lb

## 2014-04-29 DIAGNOSIS — Z01419 Encounter for gynecological examination (general) (routine) without abnormal findings: Secondary | ICD-10-CM

## 2014-04-29 DIAGNOSIS — Z13820 Encounter for screening for osteoporosis: Secondary | ICD-10-CM

## 2014-04-29 DIAGNOSIS — N950 Postmenopausal bleeding: Secondary | ICD-10-CM

## 2014-04-29 NOTE — Patient Instructions (Signed)

## 2014-04-29 NOTE — Progress Notes (Signed)
Brittany Graham 12-10-1960 [id number]    History:    Presents for annual exam.  Postmenopausal with spotting and occasional "menstrual cycle". LMP greater than 5 years ago. Was seen by a naturalist who placed on Prometrium 200 mg at bedtime daily and Vivelle dot 0.05 twice weekly, currently off antihypertensives, diagnosed with hypertension 2008. 12/2013 Ultrasound from [facility] : uterus with 2 cm fibroid, endometrial stripe poorly visualized with mild echogenicity adjacent to the endometrial canal within the uterine fundus with mild posterior shadowing. Endometrial stripe 3 mm with linear calcification associated with the endometrium within the fundus of the uterus. Ovaries normal. Reports not feeling well on antihypertensives. Normal Pap and mammogram history. Negative colonoscopy 2008 and 2013. Has not had a DEXA.  Past medical history, past surgical history, family history and social history were all reviewed and documented in the EPIC chart. Works part-time in a preschool. Sarah 16 , [name] both doing well. Parents hypertension. Mother Alzheimer's. Father heart disease. Maternal grandmother and 2 sisters colon cancer.  ROS:  A ROS was performed and pertinent positives and negatives are included.  Exam:  Filed Vitals:   04/29/14 1021  BP: 130/88    General appearance:  Normal Thyroid:  Symmetrical, normal in size, without palpable masses or nodularity. Respiratory  Auscultation:  Clear without wheezing or rhonchi Cardiovascular  Auscultation:  Regular rate, without rubs, murmurs or gallops  Edema/varicosities:  Not grossly evident Abdominal  Soft,nontender, without masses, guarding or rebound.  Liver/spleen:  No organomegaly noted  Hernia:  None appreciated  Skin  Inspection:  Grossly normal   Breasts: Examined lying and sitting.     Right: Without masses, retractions, discharge or axillary adenopathy.     Left: Without masses, retractions, discharge or axillary  adenopathy. Gentitourinary   Inguinal/mons:  Normal without inguinal adenopathy  External genitalia:  Normal  BUS/Urethra/Skene's glands:  Normal  Vagina:  Normal  Cervix:  Normal moderate amount of menses type blood  Uterus:   normal in size, shape and contour.  Midline and mobile  Adnexa/parametria:     Rt: Without masses or tenderness.   Lt: Without masses or tenderness.  Anus and perineum: Normal  Digital rectal exam: Normal sphincter tone without palpated masses or tenderness  Assessment/Plan:  54 y.o. MWF G5 P1 +1 adopted for annual exam.     DUB on HRT Fibroid uterus Hypertension-off medication with elevated blood pressure today Labs primary care  Plan: Schedule sonohysterogram with biopsy with Dr. Phineas Real, continue HRT reviewed cyclic Prometrium 395 day one through 12. Denies need for refill of Prometrium or Vivelle. SBE's, continue annual screening mammogram, 3-D tomography reviewed and encouraged. Reviewed follow-up with primary care to discuss hypertension, diastolic 88 today. Continue active lifestyle, healthy diet, vitamin D 1000 daily encouraged. Schedule DEXA . UA, reports Pap normal 2015, new screening guidelines reviewed.    [name] WHNP, 1:26 PM 04/29/2014

## 2014-04-30 ENCOUNTER — Other Ambulatory Visit: Payer: Self-pay | Admitting: Gynecology

## 2014-04-30 DIAGNOSIS — N950 Postmenopausal bleeding: Secondary | ICD-10-CM

## 2014-04-30 LAB — URINALYSIS W MICROSCOPIC + REFLEX CULTURE
BACTERIA UA: NONE SEEN
Bilirubin Urine: NEGATIVE
Casts: NONE SEEN
GLUCOSE, UA: NEGATIVE mg/dL
KETONES UR: NEGATIVE mg/dL
Leukocytes, UA: NEGATIVE
Nitrite: NEGATIVE
PH: 6.5 (ref 5.0–8.0)
Protein, ur: NEGATIVE mg/dL
Specific Gravity, Urine: 1.008 (ref 1.005–1.030)
Squamous Epithelial / LPF: NONE SEEN
Urobilinogen, UA: 0.2 mg/dL (ref 0.0–1.0)

## 2014-04-30 NOTE — Addendum Note (Signed)
Addended by: [name] on: 04/30/2014 08:46 AM   Modules accepted: Orders

## 2014-05-26 ENCOUNTER — Other Ambulatory Visit: Payer: Self-pay | Admitting: Gynecology

## 2014-05-26 DIAGNOSIS — Z13820 Encounter for screening for osteoporosis: Secondary | ICD-10-CM

## 2014-06-10 ENCOUNTER — Other Ambulatory Visit: Payer: BLUE CROSS/BLUE SHIELD

## 2014-06-10 ENCOUNTER — Ambulatory Visit: Payer: BLUE CROSS/BLUE SHIELD | Admitting: Gynecology

## 2014-06-30 ENCOUNTER — Telehealth: Payer: Self-pay | Admitting: Gynecology

## 2014-06-30 NOTE — Telephone Encounter (Signed)
06/30/14-I LM VM for patient that her [facility] insurance will cover the [facility] under her $15.00 copay. The [phone] & [phone] are covered at 100% and the [phone] & [phone] are considered office surgery and are covered under her $15.00 copay/wl

## 2014-07-08 ENCOUNTER — Ambulatory Visit (INDEPENDENT_AMBULATORY_CARE_PROVIDER_SITE_OTHER): Payer: BLUE CROSS/BLUE SHIELD | Admitting: Gynecology

## 2014-07-08 ENCOUNTER — Ambulatory Visit (INDEPENDENT_AMBULATORY_CARE_PROVIDER_SITE_OTHER): Payer: BLUE CROSS/BLUE SHIELD

## 2014-07-08 ENCOUNTER — Encounter: Payer: Self-pay | Admitting: Gynecology

## 2014-07-08 ENCOUNTER — Other Ambulatory Visit: Payer: Self-pay | Admitting: Gynecology

## 2014-07-08 VITALS — BP 118/76

## 2014-07-08 DIAGNOSIS — N950 Postmenopausal bleeding: Secondary | ICD-10-CM | POA: Diagnosis not present

## 2014-07-08 DIAGNOSIS — D251 Intramural leiomyoma of uterus: Secondary | ICD-10-CM

## 2014-07-08 DIAGNOSIS — Z79890 Hormone replacement therapy (postmenopausal): Secondary | ICD-10-CM

## 2014-07-08 NOTE — Progress Notes (Signed)
Brittany Graham February 02, 1960 [id number]        54 y.o.  [id number] present for sonohysterogram. Recently saw Brittany Graham with a history of several episodes of the last several years of bleeding on HRT. She is being treated that it an [facility] on estradiol patch initially 0.05 but increased recently to 0.1 mg and Prometrium 200 mg daily. Has had several episodes of bleeding up to including heavy menstrual type bleed. Otherwise is doing well without hot flashes or night sweats.  Past medical history,surgical history, problem list, medications, allergies, family history and social history were all reviewed and documented in the EPIC chart.  Directed ROS with pertinent positives and negatives documented in the history of present illness/assessment and plan.  Exam: Brittany Graham assistant Filed Vitals:   07/08/14 1652  BP: 118/76   General appearance:  Normal  Ultrasound shows uterus normal size. Multiple small myomas noted 21 mm, 20 mm, 16 mm. Endometrial echo 2.2 mm with calcified area along the endometrium. Right left ovaries grossly normal. Cul-de-sac negative.  Sonohysterogram performed, sterile technique, easy catheter introduction, good distention with no abnormalities. Endometrial  Sample taken. Patient tolerated well.  Assessment/Plan:  54 y.o. [id number] on HRT with several episodes of postmenopausal bleeding. Ultrasound shows thin endometrium with negative sonohysterogram for cavitary abnormalities. Patient will follow up for biopsy results. Several small myomas also noted. I suspect her bleeding is actually from atrophic changes. She is on a fairly good daily dose of progesterone and I think she would benefit from decreasing her progesterone to 100 mg daily. She is actually due to see her practitioner at the [facility] is going to talk to her about this. I did discuss with her that it does put [place] into an awkward situation as far as evaluating her for medication side effects that we are not  prescribing. There are risks to these medications such as thrombosis of breast cancer and that her prescribing physician needs to make sure they discuss these with her.  Patient will follow up for biopsy results.    Brittany Auerbach MD, 5:03 PM 07/08/2014

## 2014-07-08 NOTE — Patient Instructions (Signed)
Office will call you with biopsy results 

## 2015-03-10 ENCOUNTER — Other Ambulatory Visit: Payer: Self-pay | Admitting: Nurse Practitioner

## 2015-03-10 DIAGNOSIS — R9389 Abnormal findings on diagnostic imaging of other specified body structures: Secondary | ICD-10-CM

## 2015-03-17 ENCOUNTER — Ambulatory Visit
Admission: RE | Admit: 2015-03-17 | Discharge: 2015-03-17 | Disposition: A | Discharge status: Home / Self Care | Payer: BLUE CROSS/BLUE SHIELD | Source: Ambulatory Visit | Attending: Nurse Practitioner | Admitting: Nurse Practitioner

## 2015-03-17 DIAGNOSIS — R9389 Abnormal findings on diagnostic imaging of other specified body structures: Secondary | ICD-10-CM

## 2015-03-29 ENCOUNTER — Ambulatory Visit (INDEPENDENT_AMBULATORY_CARE_PROVIDER_SITE_OTHER): Payer: BLUE CROSS/BLUE SHIELD | Admitting: Cardiovascular Disease

## 2015-03-29 ENCOUNTER — Encounter: Payer: Self-pay | Admitting: Cardiovascular Disease

## 2015-03-29 VITALS — BP 160/100 | HR 64 | Ht 66.0 in | Wt 130.0 lb

## 2015-03-29 DIAGNOSIS — Z13220 Encounter for screening for lipoid disorders: Secondary | ICD-10-CM | POA: Diagnosis not present

## 2015-03-29 DIAGNOSIS — R0989 Other specified symptoms and signs involving the circulatory and respiratory systems: Secondary | ICD-10-CM

## 2015-03-29 DIAGNOSIS — I10 Essential (primary) hypertension: Secondary | ICD-10-CM | POA: Diagnosis not present

## 2015-03-29 DIAGNOSIS — E039 Hypothyroidism, unspecified: Secondary | ICD-10-CM

## 2015-03-29 MED ORDER — LISINOPRIL-HYDROCHLOROTHIAZIDE 20-12.5 MG PO TABS: 1.0000 | ORAL_TABLET | Freq: Every day | ORAL | Status: DC

## 2015-03-29 NOTE — Progress Notes (Signed)
Cardiology Office Note   Date:  03/29/2015   ID:  Yasuko, Pozo December 24, 1960, MRN [id number]  PCP:  Aura Dials, PA-C  Cardiologist:   Sharol Harness, MD   Chief Complaint  Patient presents with  . New Evaluation    Thermagram which showed inflammation in Rt carotid artery       History of Present Illness: DAIZY CHARD is a 55 y.o. female with hypertension who presents for management of hypertension.  Ms. Nine reports first being diagnosed with hypertension in 2012.  He has previously taken Bystolic and HCTZ/lisinopril and remembers feeling poorly on Bystolic.  It was felt that her blood pressure was elevated due to a perimenopausal state and she subsequently stopped taking any medication.Her blood pressure was okay for 2-3 years but it has been increasing since that time.She presents today for consideration of re-initiation of antihypertensive therapy.  She also had preeclampsia when pregnant. Overall, Ms. Pyeatt has been feeling well. She notes some mild shortness of breath after walking up a flight of stairs. She denies any chest pain or pressure. She is very active and doesn't have any symptoms throughout her day-to-day life. She has, however, noted some increased difficulty in concentrating.  Ms. Brizzolara sees an integrative medicine physician in [place]. She recommends that patient avoid having a mammogram every year, but rather alternate them with a thermogram every other year.  Ms. Hunker recently had her thermogram, which showed some inflammation in her right carotid artery.  She denies dizziness, vision changes, weakness, or slurred speech.    Ms. Digrazia notes that 2 of her sisters had coronary artery disease and stents placed at age 52 and 45 respectively. Her father also had coronary artery disease and had bypass surgery. Both of her sisters smoke and do not follow the same heart healthy lifestyle that she does.   Past Medical History  Diagnosis Date  .  Spontaneous abortion     X 3  . Hematuria, microscopic     HISTORY OF  . Hypertension 2008    ALSO,HX OF PREG INDUCED HYPERTENSION    Past Surgical History  Procedure Laterality Date  . Dilation and curettage of uterus       Current Outpatient Prescriptions  Medication Sig Dispense Refill  . Cholecalciferol 5000 units TABS Take by mouth once.    Marland Kitchen Co-Enzyme Q10 200 MG CAPS Take by mouth once.    . cyanocobalamin 500 MCG tablet Take 500 mcg by mouth daily.    Marland Kitchen estradiol (VIVELLE-DOT) 0.05 MG/24HR patch Place 1 patch onto the skin 2 (two) times a week.    . fish oil-omega-3 fatty acids 1000 MG capsule Take 2 g by mouth daily.      . Multiple Vitamin (MULTIVITAMIN PO) Take by mouth.      Marland Kitchen NATURE-THROID 65 MG tablet Take 1 tablet by mouth daily.    . NON FORMULARY Tarte Cherry Extract    . progesterone (PROMETRIUM) 100 MG capsule Take 100 mg by mouth daily.    Marland Kitchen Specialty Vitamins Products (MAGNESIUM, AMINO ACID CHELATE,) 133 MG tablet Take 1 tablet by mouth 2 (two) times daily.    Marland Kitchen lisinopril-hydrochlorothiazide (PRINZIDE,ZESTORETIC) 20-12.5 MG tablet Take 1 tablet by mouth daily. 30 tablet 11   No current facility-administered medications for this visit.    Allergies:   Codeine and Pindolol    Social History:  The patient  reports that she has never smoked. She has never used smokeless tobacco. She  reports that she drinks alcohol. She reports that she does not use illicit drugs.   Family History:  The patient's family history includes CAD in her father, sister, and sister; Cancer (age of onset: 18) in her maternal grandmother; Colon polyps in her sister and sister; Dementia in her mother; Heart disease in her father; Hypertension in her father and mother.    ROS:  Please see the history of present illness.   Otherwise, review of systems are positive for occasional palpitations at night and fatigue.   All other systems are reviewed and negative.    PHYSICAL EXAM: VS:  BP  160/100 mmHg  Pulse 64  Ht 5\' 6"  (1.676 m)  Wt 58.968 kg (130 lb)  BMI 20.99 kg/m2  LMP 03/25/2015 , BMI Body mass index is 20.99 kg/(m^2). GENERAL:  Well appearing HEENT:  Pupils equal round and reactive, fundi not visualized, oral mucosa unremarkable NECK:  No jugular venous distention, waveform within normal limits, carotid upstroke brisk and symmetric, no bruits, no thyromegaly LYMPHATICS:  No cervical adenopathy LUNGS:  Clear to auscultation bilaterally HEART:  RRR.  PMI not displaced or sustained,S1 and S2 within normal limits, no S3, no S4, no clicks, no rubs, no murmurs ABD:  Flat, positive bowel sounds normal in frequency in pitch, no bruits, no rebound, no guarding, no midline pulsatile mass, no hepatomegaly, no splenomegaly EXT:  2 plus pulses throughout, no edema, no cyanosis no clubbing SKIN:  No rashes no nodules NEURO:  Cranial nerves II through XII grossly intact, motor grossly intact throughout PSYCH:  Cognitively intact, oriented to person place and time  EKG:  EKG is ordered today. The ekg ordered today demonstrates sinus rhythm rate 79 bpm.     Recent Labs: No results found for requested labs within last 365 days.    Lipid Panel    Component Value Date/Time   CHOL 191 08/16/2011 1000   TRIG 107 08/16/2011 1000   HDL 52 08/16/2011 1000   CHOLHDL 3.7 08/16/2011 1000   VLDL 21 08/16/2011 1000   LDLCALC 118* 08/16/2011 1000      Wt Readings from Last 3 Encounters:  03/29/15 58.968 kg (130 lb)  04/29/14 59.875 kg (132 lb)  08/16/11 60.328 kg (133 lb)      ASSESSMENT AND PLAN:  # Hypertension: Ms. Yance lood pressure is elevated today. We will start lisinopril 20 mg/hydrochlorothiazide 12.5 mg daily. We will check a basic metabolic panel now and again in 2 weeks.  # Abnormal thermogram: Ms. Gouge does not have any carotid bruits or symptoms of carotie. However, her Dec gram shows an increased area of activity at the right carotid area. This can be  indicative of inflammation in this area. We will obtain a carotid ultrasound as well as a lipid panel.  # Hypothyroidism: Ms. Malcolm has a history of hypothyroidism and is taking a natural supplement. Her thyroid levels have not been checked recently. Given her symptoms of fatigue and difficulty concentrating, we will check a TSH and free T4 today.  Current medicines are reviewed at length with the patient today.  The patient does not have concerns regarding medicines.  The following changes have been made:  Start HCTZ/lisinopril.  Labs/ tests ordered today include:   Orders Placed This Encounter  Procedures  . Lipid panel  . Comprehensive metabolic panel  . Magnesium  . TSH  . T4, free  . EKG 12-Lead     Disposition:   FU with Kieon Lawhorn C. Oval Linsey, MD, [facility]  in 2 weeks.   This note was written with the assistance of speech recognition software.  Please excuse any transcriptional errors.  Signed, Tiant Peixoto C. Oval Linsey, MD, [facility]  03/29/2015 5:18 PM    [facility]

## 2015-03-29 NOTE — Patient Instructions (Signed)
Medication Instructions: Dr Oval Linsey has recommended making the following medication changes: START Lisinopril-Hydrochlorothiazide 20-12.5 - take 1 tablet by mouth daily  >>A new prescription has been sent to your pharmacy electronically  Labwork: Your physician recommends that you return for lab work at your earliest [place].  Testing/Procedures: 1. Carotid duplex - Your physician has requested that you have a carotid duplex. This test is an ultrasound of the carotid arteries in your neck. It looks at blood flow through these arteries that supply the brain with blood. Allow one hour for this exam. There are no restrictions or special instructions.  Follow-up: Dr Oval Linsey recommends that you schedule a follow-up appointment in 2 weeks.  If you need a refill on your cardiac medications before your next appointment, please call your pharmacy.

## 2015-03-30 LAB — LIPID PANEL
CHOLESTEROL: 177 mg/dL (ref 125–200)
HDL: 65 mg/dL (ref >=46)
LDL Cholesterol: 99 mg/dL (ref <130)
Total CHOL/HDL Ratio: 2.7 Ratio (ref <=5.0)
Triglycerides: 63 mg/dL (ref <150)
VLDL: 13 mg/dL (ref <30)

## 2015-03-30 LAB — COMPREHENSIVE METABOLIC PANEL
ALBUMIN: 4.4 g/dL (ref 3.6–5.1)
ALT: 14 U/L (ref 6–29)
AST: 23 U/L (ref 10–35)
Alkaline Phosphatase: 58 U/L (ref 33–130)
BILIRUBIN TOTAL: 1 mg/dL (ref 0.2–1.2)
BUN: 12 mg/dL (ref 7–25)
CO2: 27 mmol/L (ref 20–31)
Calcium: 9.3 mg/dL (ref 8.6–10.4)
Chloride: 104 mmol/L (ref 98–110)
Creat: 0.75 mg/dL (ref 0.50–1.05)
GLUCOSE: 83 mg/dL (ref 65–99)
Potassium: 4.3 mmol/L (ref 3.5–5.3)
Sodium: 138 mmol/L (ref 135–146)
Total Protein: 7 g/dL (ref 6.1–8.1)

## 2015-03-30 LAB — MAGNESIUM: Magnesium: 2.1 mg/dL (ref 1.5–2.5)

## 2015-03-30 LAB — TSH: TSH: 1.31 m[IU]/L

## 2015-03-30 LAB — T4, FREE: Free T4: 1.1 ng/dL (ref 0.8–1.8)

## 2015-04-01 ENCOUNTER — Telehealth: Payer: Self-pay | Admitting: *Deleted

## 2015-04-01 ENCOUNTER — Ambulatory Visit (HOSPITAL_COMMUNITY)
Admission: RE | Admit: 2015-04-01 | Discharge: 2015-04-01 | Disposition: A | Discharge status: Home / Self Care | Payer: BLUE CROSS/BLUE SHIELD | Source: Ambulatory Visit | Attending: Cardiology | Admitting: Cardiology

## 2015-04-01 DIAGNOSIS — R0989 Other specified symptoms and signs involving the circulatory and respiratory systems: Secondary | ICD-10-CM | POA: Diagnosis not present

## 2015-04-01 DIAGNOSIS — I10 Essential (primary) hypertension: Secondary | ICD-10-CM | POA: Insufficient documentation

## 2015-04-01 DIAGNOSIS — Z79899 Other long term (current) drug therapy: Secondary | ICD-10-CM

## 2015-04-01 DIAGNOSIS — I6523 Occlusion and stenosis of bilateral carotid arteries: Secondary | ICD-10-CM | POA: Diagnosis not present

## 2015-04-01 NOTE — Telephone Encounter (Signed)
-----   Message from Skeet Latch, MD sent at 03/30/2015 10:54 PM EST ----- Cholesterol levels are normal.

## 2015-04-01 NOTE — Telephone Encounter (Signed)
LEFT MESSAGE TO CALL BACK RESULTS ARE IN MY CHART

## 2015-04-05 ENCOUNTER — Other Ambulatory Visit: Payer: Self-pay

## 2015-04-07 ENCOUNTER — Encounter: Payer: Self-pay | Admitting: Cardiovascular Disease

## 2015-04-09 ENCOUNTER — Encounter: Payer: BLUE CROSS/BLUE SHIELD | Admitting: Cardiovascular Disease

## 2015-04-09 NOTE — Progress Notes (Deleted)
Cardiology Office Note   Date:  04/09/2015   ID:  Brittany Graham, DOB Jun 02, 1960, MRN [id number]  PCP:  Aura Dials, PA-C  Cardiologist:   Sharol Harness, MD   No chief complaint on file.    Patient Brittany Graham:[id number] [name] is a 55 y.o. female with hypertension who presents for management of hypertension.   Interval History 04/09/15: After her last appointment Brittany Graham was started on HCTZ/lisinopril for poorly-controlled blood pressure.  Her carotid ultrasound showed mild plaque bilaterally so she was started on aspirin 81 mg daily.  Her thryroid function was normal as was her CMP and lipid panel.   History of Present Illness 03/29/15:  Brittany Graham reports first being diagnosed with hypertension in 2012.  He has previously taken Bystolic and HCTZ/lisinopril and remembers feeling poorly on Bystolic.  It was felt that her blood pressure was elevated due to a perimenopausal state and she subsequently stopped taking any medication.Her blood pressure was okay for 2-3 years but it has been increasing since that time.She presents today for consideration of re-initiation of antihypertensive therapy.  She also had preeclampsia when pregnant. Overall, Brittany Graham has been feeling well. She notes some mild shortness of breath after walking up a flight of stairs. She denies any chest pain or pressure. She is very active and doesn't have any symptoms throughout her day-to-day life. She has, however, noted some increased difficulty in concentrating.  Brittany Graham sees an integrative medicine physician in [place]. She recommends that patient avoid having a mammogram every year, but rather alternate them with a thermogram every other year.  Brittany Graham recently had her thermogram, which showed some inflammation in her right carotid artery.  She denies dizziness, vision changes, weakness, or slurred speech.    Brittany Graham notes that 2 of her sisters had coronary artery disease and stents placed at age 74 and  39 respectively. Her father also had coronary artery disease and had bypass surgery. Both of her sisters smoke and do not follow the same heart healthy lifestyle that she does.   Past Medical History  Diagnosis Date  . Spontaneous abortion     X 3  . Hematuria, microscopic     HISTORY OF  . Hypertension 2008    ALSO,HX OF PREG INDUCED HYPERTENSION    Past Surgical History  Procedure Laterality Date  . Dilation and curettage of uterus       Current Outpatient Prescriptions  Medication Sig Dispense Refill  . aspirin EC 81 MG tablet Take 1 tablet (81 mg total) by mouth daily. 90 tablet 3  . Cholecalciferol 5000 units TABS Take by mouth once.    Marland Kitchen Co-Enzyme Q10 200 MG CAPS Take by mouth once.    . cyanocobalamin 500 MCG tablet Take 500 mcg by mouth daily.    Marland Kitchen estradiol (VIVELLE-DOT) 0.05 MG/24HR patch Place 1 patch onto the skin 2 (two) times a week.    . fish oil-omega-3 fatty acids 1000 MG capsule Take 2 g by mouth daily.      Marland Kitchen lisinopril-hydrochlorothiazide (PRINZIDE,ZESTORETIC) 20-12.5 MG tablet Take 1 tablet by mouth daily. 30 tablet 11  . Multiple Vitamin (MULTIVITAMIN PO) Take by mouth.      Marland Kitchen NATURE-THROID 65 MG tablet Take 1 tablet by mouth daily.    . NON FORMULARY Tarte Cherry Extract    . progesterone (PROMETRIUM) 100 MG capsule Take 100 mg by mouth daily.    Marland Kitchen Specialty Vitamins Products (MAGNESIUM, AMINO ACID CHELATE,) 133  MG tablet Take 1 tablet by mouth 2 (two) times daily.     No current facility-administered medications for this visit.    Allergies:   Codeine and Pindolol    Social History:  The patient  reports that she has never smoked. She has never used smokeless tobacco. She reports that she drinks alcohol. She reports that she does not use illicit drugs.   Family History:  The patient's family history includes CAD in her father, sister, and sister; Cancer (age of onset: 37) in her maternal grandmother; Colon polyps in her sister and sister; Dementia in  her mother; Heart attack in her sister and sister; Heart disease in her father; Hypertension in her father and mother.    ROS:  Please see the history of present illness.   Otherwise, review of systems are positive for occasional palpitations at night and fatigue.   All other systems are reviewed and negative.    PHYSICAL EXAM: VS:  LMP 03/25/2015 , BMI There is no weight on file to calculate BMI. GENERAL:  Well appearing HEENT:  Pupils equal round and reactive, fundi not visualized, oral mucosa unremarkable NECK:  No jugular venous distention, waveform within normal limits, carotid upstroke brisk and symmetric, no bruits, no thyromegaly LYMPHATICS:  No cervical adenopathy LUNGS:  Clear to auscultation bilaterally HEART:  RRR.  PMI not displaced or sustained,S1 and S2 within normal limits, no S3, no S4, no clicks, no rubs, no murmurs ABD:  Flat, positive bowel sounds normal in frequency in pitch, no bruits, no rebound, no guarding, no midline pulsatile mass, no hepatomegaly, no splenomegaly EXT:  2 plus pulses throughout, no edema, no cyanosis no clubbing SKIN:  No rashes no nodules NEURO:  Cranial nerves II through XII grossly intact, motor grossly intact throughout PSYCH:  Cognitively intact, oriented to person place and time  EKG:  EKG is ordered today. The ekg ordered today demonstrates sinus rhythm rate 79 bpm.     Recent Labs: 03/30/2015: ALT 14; BUN 12; Creat 0.75; Magnesium 2.1; Potassium 4.3; Sodium 138; TSH 1.31    Lipid Panel    Component Value Date/Time   CHOL 177 03/30/2015 0836   TRIG 63 03/30/2015 0836   HDL 65 03/30/2015 0836   CHOLHDL 2.7 03/30/2015 0836   VLDL 13 03/30/2015 0836   LDLCALC 99 03/30/2015 0836      Wt Readings from Last 3 Encounters:  03/29/15 58.968 kg (130 lb)  04/29/14 59.875 kg (132 lb)  08/16/11 60.328 kg (133 lb)      ASSESSMENT AND PLAN:  # Hypertension: Brittany Graham lood pressure is elevated today. We will start lisinopril 20  mg/hydrochlorothiazide 12.5 mg daily. We will check a basic metabolic panel now and again in 2 weeks.  # Abnormal thermogram: Brittany Graham does not have any carotid bruits or symptoms of carotie. However, her Dec gram shows an increased area of activity at the right carotid area. This can be indicative of inflammation in this area. We will obtain a carotid ultrasound as well as a lipid panel.  # Hypothyroidism: Ms. Langin has a history of hypothyroidism and is taking a natural supplement. Her thyroid levels have not been checked recently. Given her symptoms of fatigue and difficulty concentrating, we will check a TSH and free T4 today.  Current medicines are reviewed at length with the patient today.  The patient does not have concerns regarding medicines.  The following changes have been made:  Start HCTZ/lisinopril.  Labs/ tests ordered today include:  No orders of the defined types were placed in this encounter.     Disposition:   FU with Lesly Pontarelli C. Oval Linsey, MD, [place] in 2 weeks.   This note was written with the assistance of speech recognition software.  Please excuse any transcriptional errors.  Signed, Prestin Munch C. Oval Linsey, MD, [facility]  04/09/2015 2:59 PM    [place]

## 2015-04-14 ENCOUNTER — Telehealth: Payer: Self-pay | Admitting: *Deleted

## 2015-04-14 DIAGNOSIS — Z79899 Other long term (current) drug therapy: Secondary | ICD-10-CM

## 2015-04-14 NOTE — Telephone Encounter (Signed)
-----   Message from Skeet Latch, MD sent at 04/12/2015  6:00 PM EDT ----- Please order BMP for Brittany Graham to get next week.  Indication is drug monitoring and hypertension.  She is also requesting a copy of her carotid ultrasound.  Also, please cancel her April appointment and schedule for 6 month follow up.

## 2015-04-14 NOTE — Telephone Encounter (Signed)
Advised patient

## 2015-04-17 NOTE — Progress Notes (Signed)
This encounter was created in error - please disregard.

## 2015-04-20 LAB — BASIC METABOLIC PANEL
BUN: 16 mg/dL (ref 7–25)
CHLORIDE: 101 mmol/L (ref 98–110)
CO2: 28 mmol/L (ref 20–31)
CREATININE: 0.81 mg/dL (ref 0.50–1.05)
Calcium: 9.6 mg/dL (ref 8.6–10.4)
Glucose, Bld: 102 mg/dL — ABNORMAL HIGH (ref 65–99)
Potassium: 4.3 mmol/L (ref 3.5–5.3)
Sodium: 140 mmol/L (ref 135–146)

## 2015-05-03 ENCOUNTER — Ambulatory Visit: Payer: BLUE CROSS/BLUE SHIELD | Admitting: Cardiovascular Disease

## 2015-05-05 ENCOUNTER — Encounter: Payer: Self-pay | Admitting: Women's Health

## 2015-05-05 ENCOUNTER — Ambulatory Visit (INDEPENDENT_AMBULATORY_CARE_PROVIDER_SITE_OTHER): Payer: BLUE CROSS/BLUE SHIELD | Admitting: Women's Health

## 2015-05-05 VITALS — BP 124/80 | Ht 66.0 in | Wt 127.0 lb

## 2015-05-05 DIAGNOSIS — Z124 Encounter for screening for malignant neoplasm of cervix: Secondary | ICD-10-CM

## 2015-05-05 NOTE — Patient Instructions (Signed)
Menopause is a normal process in which your reproductive ability comes to an end. This process happens gradually over a span of months to years, usually between the ages of 48 and 56. Menopause is complete when you have missed 12 consecutive menstrual periods. It is important to talk with your health care provider about some of the most common conditions that affect postmenopausal women, such as heart disease, cancer, and bone loss (osteoporosis). Adopting a healthy lifestyle and getting preventive care can help to promote your health and wellness. Those actions can also lower your chances of developing some of these common conditions. WHAT SHOULD I KNOW ABOUT MENOPAUSE? During menopause, you may experience a number of symptoms, such as:  Moderate-to-severe hot flashes.  Night sweats.  Decrease in sex drive.  Mood swings.  Headaches.  Tiredness.  Irritability.  Memory problems.  Insomnia. Choosing to treat or not to treat menopausal changes is an individual decision that you make with your health care provider. WHAT SHOULD I KNOW ABOUT HORMONE REPLACEMENT THERAPY AND SUPPLEMENTS? Hormone therapy products are effective for treating symptoms that are associated with menopause, such as hot flashes and night sweats. Hormone replacement carries certain risks, especially as you become older. If you are thinking about using estrogen or estrogen with progestin treatments, discuss the benefits and risks with your health care provider. WHAT SHOULD I KNOW ABOUT HEART DISEASE AND STROKE? Heart disease, heart attack, and stroke become more likely as you age. This may be due, in part, to the hormonal changes that your body experiences during menopause. These can affect how your body processes dietary fats, triglycerides, and cholesterol. Heart attack and stroke are both medical emergencies. There are many things that you can do to help prevent heart disease and stroke:  Have your blood pressure  checked at least every 1-2 years. High blood pressure causes heart disease and increases the risk of stroke.  If you are 56-79 years old, ask your health care provider if you should take aspirin to prevent a heart attack or a stroke.  Do not use any tobacco products, including cigarettes, chewing tobacco, or electronic cigarettes. If you need help quitting, ask your health care provider.  It is important to eat a healthy diet and maintain a healthy weight.  Be sure to include plenty of vegetables, fruits, low-fat dairy products, and lean protein.  Avoid eating foods that are high in solid fats, added sugars, or salt (sodium).  Get regular exercise. This is one of the most important things that you can do for your health.  Try to exercise for at least 150 minutes each week. The type of exercise that you do should increase your heart rate and make you sweat. This is known as moderate-intensity exercise.  Try to do strengthening exercises at least twice each week. Do these in addition to the moderate-intensity exercise.  Know your numbers.Ask your health care provider to check your cholesterol and your blood glucose. Continue to have your blood tested as directed by your health care provider. WHAT SHOULD I KNOW ABOUT CANCER SCREENING? There are several types of cancer. Take the following steps to reduce your risk and to catch any cancer development as early as possible. Breast Cancer  Practice breast self-awareness.  This means understanding how your breasts normally appear and feel.  It also means doing regular breast self-exams. Let your health care provider know about any changes, no matter how small.  If you are 40 or older, have a clinician do a   breast exam (clinical breast exam or CBE) every year. Depending on your age, family history, and medical history, it may be recommended that you also have a yearly breast X-ray (mammogram).  If you have a family history of breast cancer,  talk with your health care provider about genetic screening.  If you are at high risk for breast cancer, talk with your health care provider about having an MRI and a mammogram every year.  Breast cancer (BRCA) gene test is recommended for women who have family members with BRCA-related cancers. Results of the assessment will determine the need for genetic counseling and BRCA1 and for BRCA2 testing. BRCA-related cancers include these types:  Breast. This occurs in males or females.  Ovarian.  Tubal. This may also be called fallopian tube cancer.  Cancer of the abdominal or pelvic lining (peritoneal cancer).  Prostate.  Pancreatic. Cervical, Uterine, and Ovarian Cancer Your health care provider may recommend that you be screened regularly for cancer of the pelvic organs. These include your ovaries, uterus, and vagina. This screening involves a pelvic exam, which includes checking for microscopic changes to the surface of your cervix (Pap test).  For women ages 21-65, health care providers may recommend a pelvic exam and a Pap test every three years. For women ages 77-65, they may recommend the Pap test and pelvic exam, combined with testing for human papilloma virus (HPV), every five years. Some types of HPV increase your risk of cervical cancer. Testing for HPV may also be done on women of any age who have unclear Pap test results.  Other health care providers may not recommend any screening for nonpregnant women who are considered low risk for pelvic cancer and have no symptoms. Ask your health care provider if a screening pelvic exam is right for you.  If you have had past treatment for cervical cancer or a condition that could lead to cancer, you need Pap tests and screening for cancer for at least 20 years after your treatment. If Pap tests have been discontinued for you, your risk factors (such as having a new sexual partner) need to be reassessed to determine if you should start having  screenings again. Some women have medical problems that increase the chance of getting cervical cancer. In these cases, your health care provider may recommend that you have screening and Pap tests more often.  If you have a family history of uterine cancer or ovarian cancer, talk with your health care provider about genetic screening.  If you have vaginal bleeding after reaching menopause, tell your health care provider.  There are currently no reliable tests available to screen for ovarian cancer. Lung Cancer Lung cancer screening is recommended for adults 3-70 years old who are at high risk for lung cancer because of a history of smoking. A yearly low-dose CT scan of the lungs is recommended if you:  Currently smoke.  Have a history of at least 30 pack-years of smoking and you currently smoke or have quit within the past 15 years. A pack-year is smoking an average of one pack of cigarettes per day for one year. Yearly screening should:  Continue until it has been 15 years since you quit.  Stop if you develop a health problem that would prevent you from having lung cancer treatment. Colorectal Cancer  This type of cancer can be detected and can often be prevented.  Routine colorectal cancer screening usually begins at age 38 and continues through age 12.  If you have  risk factors for colon cancer, your health care provider may recommend that you be screened at an earlier age.  If you have a family history of colorectal cancer, talk with your health care provider about genetic screening.  Your health care provider may also recommend using home test kits to check for hidden blood in your stool.  A small camera at the end of a tube can be used to examine your colon directly (sigmoidoscopy or colonoscopy). This is done to check for the earliest forms of colorectal cancer.  Direct examination of the colon should be repeated every 5-10 years until age 67. However, if early forms of  precancerous polyps or small growths are found or if you have a family history or genetic risk for colorectal cancer, you may need to be screened more often. Skin Cancer  Check your skin from head to toe regularly.  Monitor any moles. Be sure to tell your health care provider:  About any new moles or changes in moles, especially if there is a change in a mole's shape or color.  If you have a mole that is larger than the size of a pencil eraser.  If any of your family members has a history of skin cancer, especially at a young age, talk with your health care provider about genetic screening.  Always use sunscreen. Apply sunscreen liberally and repeatedly throughout the day.  Whenever you are outside, protect yourself by wearing long sleeves, pants, a wide-brimmed hat, and sunglasses. WHAT SHOULD I KNOW ABOUT OSTEOPOROSIS? Osteoporosis is a condition in which bone destruction happens more quickly than new bone creation. After menopause, you may be at an increased risk for osteoporosis. To help prevent osteoporosis or the bone fractures that can happen because of osteoporosis, the following is recommended:  If you are 39-61 years old, get at least 1,000 mg of calcium and at least 600 mg of vitamin D per day.  If you are older than age 16 but younger than age 7, get at least 1,200 mg of calcium and at least 600 mg of vitamin D per day.  If you are older than age 47, get at least 1,200 mg of calcium and at least 800 mg of vitamin D per day. Smoking and excessive alcohol intake increase the risk of osteoporosis. Eat foods that are rich in calcium and vitamin D, and do weight-bearing exercises several times each week as directed by your health care provider. WHAT SHOULD I KNOW ABOUT HOW MENOPAUSE AFFECTS [place]? Depression may occur at any age, but it is more common as you become older. Common symptoms of depression include:  Low or sad mood.  Changes in sleep patterns.  Changes  in appetite or eating patterns.  Feeling an overall lack of motivation or enjoyment of activities that you previously enjoyed.  Frequent crying spells. Talk with your health care provider if you think that you are experiencing depression. WHAT SHOULD I KNOW ABOUT IMMUNIZATIONS? It is important that you get and maintain your immunizations. These include:  Tetanus, diphtheria, and pertussis (Tdap) booster vaccine.  Influenza every year before the flu season begins.  Pneumonia vaccine.  Shingles vaccine. Your health care provider may also recommend other immunizations.   This information is not intended to replace advice given to you by your health care provider. Make sure you discuss any questions you have with your health care provider.   Document Released: 03/10/2005 Document Revised: 02/06/2014 Document Reviewed: 09/18/2013 Elsevier Interactive Patient Education 2016 Else[employer]  Inc. Human Papillomavirus Quadrivalent Vaccine suspension for injection What is this medicine? HUMAN PAPILLOMAVIRUS VACCINE (HYOO muhn pap uh LOH muh vahy ruhs vak SEEN) is a vaccine. It is used to prevent infections of four types of the human papillomavirus. In women, the vaccine may lower your risk of getting cervical, vaginal, vulvar, or anal cancer and genital warts. In men, the vaccine may lower your risk of getting genital warts and anal cancer. You cannot get these diseases from the vaccine. This vaccine does not treat these diseases. This medicine may be used for other purposes; ask your health care provider or pharmacist if you have questions. What should I tell my health care provider before I take this medicine? They need to know if you have any of these conditions: -fever or infection -hemophilia -HIV infection or AIDS -immune system problems -low platelet count -an unusual reaction to Human Papillomavirus Vaccine, yeast, other medicines, foods, dyes, or preservatives -pregnant or trying to get  pregnant -breast-feeding How should I use this medicine? This vaccine is for injection in a muscle on your upper arm or thigh. It is given by a health care professional. Dennis Bast will be observed for 15 minutes after each dose. Sometimes, fainting happens after the vaccine is given. You may be asked to sit or lie down during the 15 minutes. Three doses are given. The second dose is given 2 months after the first dose. The last dose is given 4 months after the second dose. A copy of a Vaccine Information Statement will be given before each vaccination. Read this sheet carefully each time. The sheet may change frequently. Talk to your pediatrician regarding the use of this medicine in children. While this drug may be prescribed for children as young as 64 years of age for selected conditions, precautions do apply. Overdosage: If you think you have taken too much of this medicine contact a poison control center or emergency room at once. NOTE: This medicine is only for you. Do not share this medicine with others. What if I miss a dose? All 3 doses of the vaccine should be given within 6 months. Remember to keep appointments for follow-up doses. Your health care provider will tell you when to return for the next vaccine. Ask your health care professional for advice if you are unable to keep an appointment or miss a scheduled dose. What may interact with this medicine? -other vaccines This list may not describe all possible interactions. Give your health care provider a list of all the medicines, herbs, non-prescription drugs, or dietary supplements you use. Also tell them if you smoke, drink alcohol, or use illegal drugs. Some items may interact with your medicine. What should I watch for while using this medicine? This vaccine may not fully protect everyone. Continue to have regular pelvic exams and cervical or anal cancer screenings as directed by your doctor. The Human Papillomavirus is a sexually  transmitted disease. It can be passed by any kind of sexual activity that involves genital contact. The vaccine works best when given before you have any contact with the virus. Many people who have the virus do not have any signs or symptoms. Tell your doctor or health care professional if you have any reaction or unusual symptom after getting the vaccine. What side effects may I notice from receiving this medicine? Side effects that you should report to your doctor or health care professional as soon as possible: -allergic reactions like skin rash, itching or hives, swelling of the  face, lips, or tongue -breathing problems -feeling faint or lightheaded, falls Side effects that usually do not require medical attention (report to your doctor or health care professional if they continue or are bothersome): -cough -dizziness -fever -headache -nausea -redness, warmth, swelling, pain, or itching at site where injected This list may not describe all possible side effects. Call your doctor for medical advice about side effects. You may report side effects to FDA at 1-800-FDA-1088. Where should I keep my medicine? This drug is given in a hospital or clinic and will not be stored at home. NOTE: This sheet is a summary. It may not cover all possible information. If you have questions about this medicine, talk to your doctor, pharmacist, or health care provider.    2016, Elsevier/Gold Standard. (2013-03-10 13:14:33)

## 2015-05-05 NOTE — Progress Notes (Signed)
Brittany Graham [birth year] [id number]    History:    Presents for annual exam.  Normal Pap and mammogram history. Currently on Prometrium 100 mg and estradiol patch 0.05  3 weeks of each month and does have a small withdrawal bleed when off - prescribed per primary care. 2008, 2013 negative colonoscopies. Has 2 sisters with colon cancer. 2008 diagnosed with hypertension cardiologist manages.  Past medical history, past surgical history, family history and social history were all reviewed and documented in the EPIC chart. 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 Brittany Graham 55 hoping to go to Brittany Graham 55 both doing well. Mother Alzheimer's, father heart disease. Parents hypertension, 2 sisters with heart disease.  ROS:  A ROS was performed and pertinent positives and negatives are included.  Exam:  Filed Vitals:   56/05/17 1557  BP: 124/80    General appearance:  Normal Thyroid:  Symmetrical, normal in size, without palpable masses or nodularity. Respiratory  Auscultation:  Clear without wheezing or rhonchi Cardiovascular  Auscultation:  Regular rate, without rubs, murmurs or gallops  Edema/varicosities:  Not grossly evident Abdominal  Soft,nontender, without masses, guarding or rebound.  Liver/spleen:  No organomegaly noted  Hernia:  None appreciated  Skin  Inspection:  Grossly normal   Breasts: Examined lying and sitting.     Right: Without masses, retractions, discharge or axillary adenopathy.     Left: Without masses, retractions, discharge or axillary adenopathy. Gentitourinary   Inguinal/mons:  Normal without inguinal adenopathy  External genitalia:  Normal  BUS/Urethra/Skene's glands:  Normal  Vagina:  Normal  Cervix:  Normal  Uterus:   normal in size, shape and contour.  Midline and mobile  Adnexa/parametria:     Rt: Without masses or tenderness.   Lt: Without masses or tenderness.  Anus and perineum: Normal  Digital rectal exam: Normal sphincter  tone without palpated masses or tenderness  Assessment/Plan:  55 y.o. MWFG5 P1 +1 adopted for annual exam with no complaints.  Postmenopausal on HRT prescribed per primary care Hypertension-cardiologist manages Labs primary care and cardiologist  Plan: Women's health initiative, risks of HRT in relationship to hypertension and blood clots/strokes reviewed. Has felt better while on, third year. BEs, continue annual 3-D screening mammogram history of dense tissue, normal after ultrasound 03/2015. UA, Pap with HR HPV typing, new screening guidelines reviewed.    Brittany Graham [facility], 5:28 PM 05/05/2015

## 2015-05-10 LAB — PAP, TP IMAGING W/ HPV RNA, RFLX HPV TYPE 16,18/45: HPV mRNA, High Risk: NOT DETECTED

## 2015-09-24 ENCOUNTER — Ambulatory Visit: Payer: BLUE CROSS/BLUE SHIELD | Admitting: Cardiovascular Disease

## 2015-10-08 ENCOUNTER — Ambulatory Visit: Payer: BLUE CROSS/BLUE SHIELD | Admitting: Cardiovascular Disease

## 2015-10-22 ENCOUNTER — Encounter: Payer: Self-pay | Admitting: Cardiovascular Disease

## 2015-10-25 ENCOUNTER — Encounter: Payer: Self-pay | Admitting: Cardiovascular Disease

## 2015-10-25 ENCOUNTER — Ambulatory Visit (INDEPENDENT_AMBULATORY_CARE_PROVIDER_SITE_OTHER): Payer: BLUE CROSS/BLUE SHIELD | Admitting: Cardiovascular Disease

## 2015-10-25 VITALS — BP 133/87 | HR 70 | Ht 66.0 in | Wt 130.2 lb

## 2015-10-25 DIAGNOSIS — I10 Essential (primary) hypertension: Secondary | ICD-10-CM | POA: Diagnosis not present

## 2015-10-25 DIAGNOSIS — I6523 Occlusion and stenosis of bilateral carotid arteries: Secondary | ICD-10-CM | POA: Diagnosis not present

## 2015-10-25 DIAGNOSIS — I6529 Occlusion and stenosis of unspecified carotid artery: Secondary | ICD-10-CM

## 2015-10-25 HISTORY — DX: Occlusion and stenosis of unspecified carotid artery: I65.29

## 2015-10-25 MED ORDER — LISINOPRIL 10 MG PO TABS: 10.0000 mg | ORAL_TABLET | Freq: Every day | ORAL | 5 refills | Status: DC

## 2015-10-25 NOTE — Patient Instructions (Addendum)
Medication Instructions:  STOP LISINOPRIL HCT  START LISINOPRIL 10 MG DAILY  Labwork: NONE  Testing/Procedures: NONE  Follow-Up: Your physician recommends that you schedule a follow-up appointment in: [place] wants you to follow-up in: [place] will receive a reminder letter in the mail two months in advance. If you don't receive a letter, please call our office to schedule the follow-up appointment.  Any Other Special Instructions Will Be Listed Below (If Applicable). MONITOR YOUR BLOOD PRESSURE AT HOME AND IF IT STARTS TO GO UP CALL THE OFFICE   If you need a refill on your cardiac medications before your next appointment, please call your pharmacy.

## 2015-10-25 NOTE — Progress Notes (Signed)
Cardiology Office Note   Date:  10/25/2015   ID:  Brittany Graham, Brittany Graham 1960-03-06, MRN [id number]  PCP:  Aura Dials, PA-C  Cardiologist:   Skeet Latch, MD   Chief Complaint  Patient presents with  . Follow-up    edema in hands occasionally. Pt states no other Sx.       History of Present Illness: Brittany Graham is a 55 y.o. female with hypertension who presents for management of hypertension.  Ms. Ludy reports first being diagnosed with hypertension in 2012.  He has previously taken Bystolic and HCTZ/lisinopril and remembers feeling poorly on Bystolic.  It was felt that her blood pressure was elevated due to a perimenopausal state and she subsequently stopped taking any medication.Her blood pressure was okay for 2-3 years but it has been increasing since that time.She presents today for consideration of re-initiation of antihypertensive therapy.  She also had preeclampsia when pregnant. Overall, Ms. Zerangue has been feeling well. She notes some mild shortness of breath after walking up a flight of stairs. She denies any chest pain or pressure. She is very active and doesn't have any symptoms throughout her day-to-day life. She has, however, noted some increased difficulty in concentrating.  Ms. Baza sees an integrative medicine physician in [place]. She recommends that patient avoid having a mammogram every year, but rather alternate them with a thermogram every other year.  Ms. Linkenhoker recently had her thermogram, which showed some inflammation in her right carotid artery.  She denies dizziness, vision changes, weakness, or slurred speech.    Ms. Nolette notes that 2 of her sisters had coronary artery disease and stents placed at age 59 and 72 respectively. Her father also had coronary artery disease and had bypass surgery. Both of her sisters smoke and do not follow the same heart healthy lifestyle that she does.     After her last appointment Ms. Swanick was started on  HCTZ/lisinopirl due to poorly-controlled hypertension.  She also had a carotid Doppler that showed mild bilateral stenosis.  She started aspirin 81 mg daily.   Cutting capsule in half  Ride bikes  Past Medical History:  Diagnosis Date  . Carotid stenosis 10/25/2015  . Hematuria, microscopic    HISTORY OF  . Hypertension 2008   ALSO,HX OF PREG INDUCED HYPERTENSION  . Spontaneous abortion    X 3    Past Surgical History:  Procedure Laterality Date  . DILATION AND CURETTAGE OF UTERUS       Current Outpatient Prescriptions  Medication Sig Dispense Refill  . aspirin EC 81 MG tablet Take 1 tablet (81 mg total) by mouth daily. 90 tablet 3  . Cholecalciferol 5000 units TABS Take by mouth once.    Marland Kitchen Co-Enzyme Q10 200 MG CAPS Take by mouth once.    . cyanocobalamin 500 MCG tablet Take 500 mcg by mouth daily.    Marland Kitchen estradiol (VIVELLE-DOT) 0.05 MG/24HR patch Place 1 patch onto the skin 2 (two) times a week.    . fish oil-omega-3 fatty acids 1000 MG capsule Take 2 g by mouth daily.      . Multiple Vitamin (MULTIVITAMIN PO) Take by mouth.      Marland Kitchen NATURE-THROID 65 MG tablet Take 1 tablet by mouth daily.    . NON FORMULARY Tarte Cherry Extract    . progesterone (PROMETRIUM) 100 MG capsule Take 100 mg by mouth 2 (two) times daily.     Marland Kitchen Specialty Vitamins Products (MAGNESIUM, AMINO ACID CHELATE,) 133  MG tablet Take 1 tablet by mouth 2 (two) times daily.    Marland Kitchen lisinopril (PRINIVIL,ZESTRIL) 10 MG tablet Take 1 tablet (10 mg total) by mouth daily. 30 tablet 5   No current facility-administered medications for this visit.     Allergies:   Codeine and Pindolol    Social History:  The patient  reports that she has never smoked. She has never used smokeless tobacco. She reports that she drinks alcohol. She reports that she does not use drugs.   Family History:  The patient's family history includes CAD in her father, sister, and sister; Cancer (age of onset: 34) in her maternal grandmother; Colon  polyps in her sister and sister; Dementia in her mother; Heart attack in her sister and sister; Heart disease in her father; Hypertension in her father and mother.    ROS:  Please see the history of present illness.   Otherwise, review of systems are positive for occasional palpitations at night and fatigue.   All other systems are reviewed and negative.    PHYSICAL EXAM: VS:  BP 133/87   Pulse 70   Ht 5\' 6"  (1.676 m)   Wt 130 lb 3.2 oz (59.1 kg)   BMI 21.01 kg/m  , BMI Body mass index is 21.01 kg/m. GENERAL:  Well appearing HEENT:  Pupils equal round and reactive, fundi not visualized, oral mucosa unremarkable NECK:  No jugular venous distention, waveform within normal limits, carotid upstroke brisk and symmetric, no bruits, no thyromegaly LYMPHATICS:  No cervical adenopathy LUNGS:  Clear to auscultation bilaterally HEART:  RRR.  PMI not displaced or sustained,S1 and S2 within normal limits, no S3, no S4, no clicks, no rubs, no murmurs ABD:  Flat, positive bowel sounds normal in frequency in pitch, no bruits, no rebound, no guarding, no midline pulsatile mass, no hepatomegaly, no splenomegaly EXT:  2 plus pulses throughout, no edema, no cyanosis no clubbing SKIN:  No rashes no nodules NEURO:  Cranial nerves II through XII grossly intact, motor grossly intact throughout PSYCH:  Cognitively intact, oriented to person place and time  EKG:  EKG is ordered today. The ekg ordered 10/25/15 demonstrates sinus rhythm rate 70 bpm.    Carotid Dopplers 04/02/15:1-39% ICA stenosis bilaterally.  Recent Labs: 03/30/2015: ALT 14; Magnesium 2.1; TSH 1.31 04/19/2015: BUN 16; Creat 0.81; Potassium 4.3; Sodium 140    Lipid Panel    Component Value Date/Time   CHOL 177 03/30/2015 0836   TRIG 63 03/30/2015 0836   HDL 65 03/30/2015 0836   CHOLHDL 2.7 03/30/2015 0836   VLDL 13 03/30/2015 0836   LDLCALC 99 03/30/2015 0836      Wt Readings from Last 3 Encounters:  10/25/15 130 lb 3.2 oz (59.1 kg)    05/05/15 127 lb (57.6 kg)  03/29/15 130 lb (59 kg)      ASSESSMENT AND PLAN:  # Hypertension: Ms. Saler blood pressure is much better-controlled.  She has been taking half of her lisinopril-HCTZ tablet.  We will switch this to lisinopril 10 mg daily.  She will keep checking her BP and increase to 20 mg if it is >140/90.  She will meet with our pharmacists in 1 month and bring her BP log and BP cuff.   # Carotid stenosis: Ms. Damm has mild bilateral stenosis.  LDL is 99.  Continue aspirin 81 mg daily.     Current medicines are reviewed at length with the patient today.  The patient does not have concerns regarding medicines.  The following changes have been made:  Start HCTZ/lisinopril.  Labs/ tests ordered today include:   Orders Placed This Encounter  Procedures  . EKG 12-Lead     Disposition:   FU with Baudelia Schroepfer C. Oval Linsey, MD, [facility] in 1 year.   This note was written with the assistance of speech recognition software.  Please excuse any transcriptional errors.  Signed, Shatonia Hoots C. Oval Linsey, MD, [facility]  10/25/2015 4:53 PM    [facility]

## 2015-11-22 ENCOUNTER — Ambulatory Visit (INDEPENDENT_AMBULATORY_CARE_PROVIDER_SITE_OTHER): Payer: BLUE CROSS/BLUE SHIELD | Admitting: Pharmacist

## 2015-11-22 VITALS — BP 120/66 | HR 77

## 2015-11-22 DIAGNOSIS — I10 Essential (primary) hypertension: Secondary | ICD-10-CM | POA: Diagnosis not present

## 2015-11-22 NOTE — Patient Instructions (Addendum)
Return for a follow up appointment as scheduled with Dr. Oval Linsey  Check your blood pressure at home daily (if able) and keep record of the readings.  Take your BP meds as follows: Continue lisinopril 10mg  daily    Bring all of your meds, your BP cuff and your record of home blood pressures to your next appointment.  Exercise as you're able, try to walk approximately 30 minutes per day.  Keep salt intake to a minimum, especially watch canned and prepared boxed foods.  Eat more fresh fruits and vegetables and fewer canned items.  Avoid eating in fast food restaurants.    HOW TO TAKE YOUR BLOOD PRESSURE: . Rest 5 minutes before taking your blood pressure. .  Don't smoke or drink caffeinated beverages for at least 30 minutes before. . Take your blood pressure before (not after) you eat. . Sit comfortably with your back supported and both feet on the floor (don't cross your legs). . Elevate your arm to heart level on a table or a desk. . Use the proper sized cuff. It should fit smoothly and snugly around your bare upper arm. There should be enough room to slip a fingertip under the cuff. The bottom edge of the cuff should be 1 inch above the crease of the elbow. . Ideally, take 3 measurements at one sitting and record the average.

## 2015-11-22 NOTE — Progress Notes (Signed)
Patient ID: Brittany Graham                 DOB: 26-Jul-1960                      MRN: [id number]     HPI: Brittany Graham is a 55 y.o. female patient of Dr. Oval Linsey with PMH below who presents today for hypertension evaluation.  She was recently seen by Dr. Oval Linsey and her lisinopril/HCTZ 20-12.5mg  was changed to lisinopril 10 mg alone. She was instructed to increase to 20mg  daily if her pressures were not controlled.   In clinic today, pt reports that she has been able to stay at the lower dose of lisinopril 10 mg and reports taking the medication at night. She does report that she has felt "shaky like she's hungry" since stopping the HCTZ and was wondering if this is a side effect of discontinuation. She reports that it has lasted most of the day on the days that it occurs but denies these symptoms today.  Cardiac Hx: HTN (pre-eclampsia during single pregnancy) , carotid stenosis  Current HTN meds:  Lisinopril 10mg  daily   Previously tried:  Lisinopril/HCTZ 20-12.5mg -BP was well controlled so decreased to current regiment  BP goal: <140/90  Family History: Pt reports that her mother had HTN and her father had several bypass surgeries. She has siblings but reports that none of them have HTN.   Social History: Pt is a never smoker and denies smokeless tobacco. She also has 2 glasses of wine/week usually on weekends.  Diet: Pt eats most of her meals from home and uses salt/seasonings when she cooks. She does not add salt at the table. She has 2 cups of coffee every morning but denies any soda or tea.   Exercise: Pt reports walking between 2.5-4 miles 2-3 times/week. 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   Home BP readings: Pt's log from home shows BPs mainly in the 130s-140s/70s-80s. She had one reading of 155/75 that upon recheck 5 minutes later had returned to goal. She has an Omron cuff that she has brought to clinic and read her BP as 133/71 in clinic using this  machine which measures about 10 mmHg higher than manual readings.   Wt Readings from Last 3 Encounters:  10/25/15 130 lb 3.2 oz (59.1 kg)  05/05/15 127 lb (57.6 kg)  03/29/15 130 lb (59 kg)   BP Readings from Last 3 Encounters:  11/22/15 120/66  10/25/15 133/87  05/05/15 124/80   Pulse Readings from Last 3 Encounters:  11/22/15 77  10/25/15 70  03/29/15 64    Renal function: CrCl cannot be calculated (Unknown ideal weight.).  Past Medical History:  Diagnosis Date  . Carotid stenosis 10/25/2015  . Hematuria, microscopic    HISTORY OF  . Hypertension 2008   ALSO,HX OF PREG INDUCED HYPERTENSION  . Spontaneous abortion    X 3    Current Outpatient Prescriptions on File Prior to Visit  Medication Sig Dispense Refill  . aspirin EC 81 MG tablet Take 1 tablet (81 mg total) by mouth daily. 90 tablet 3  . Cholecalciferol 5000 units TABS Take by mouth once.    Marland Kitchen Co-Enzyme Q10 200 MG CAPS Take by mouth once.    . cyanocobalamin 500 MCG tablet Take 500 mcg by mouth daily.    Marland Kitchen estradiol (VIVELLE-DOT) 0.05 MG/24HR patch Place 1 patch onto the skin 2 (two) times  a week.    . fish oil-omega-3 fatty acids 1000 MG capsule Take 2 g by mouth daily.      Marland Kitchen lisinopril (PRINIVIL,ZESTRIL) 10 MG tablet Take 1 tablet (10 mg total) by mouth daily. 30 tablet 5  . Multiple Vitamin (MULTIVITAMIN PO) Take by mouth.      Marland Kitchen NATURE-THROID 65 MG tablet Take 1 tablet by mouth daily.    . NON FORMULARY Tarte Cherry Extract    . progesterone (PROMETRIUM) 100 MG capsule Take 100 mg by mouth 2 (two) times daily.     Marland Kitchen Specialty Vitamins Products (MAGNESIUM, AMINO ACID CHELATE,) 133 MG tablet Take 1 tablet by mouth 2 (two) times daily.     No current facility-administered medications on file prior to visit.     Allergies  Allergen Reactions  . Codeine     REACTION: Headache  . Pindolol     Dizziness     Blood pressure 120/66, pulse 77, SpO2 98 %.   Assessment/Plan: Hypertension: BP is well  controlled in clinic today. Will continue lisinopril 10 mg daily and counseled the patient to continue to monitor home BPs.Counseled that side effects of shakiness most likely unrelated to HCTZ discontinuation and to continue to monitor and call with any concerns. Will follow up with Dr. Oval Linsey as instructed and with the HTN clinic as needed.   Thank you, Lelan Pons. Patterson Hammersmith, [facility]  11/22/2015 2:40 PM

## 2015-12-16 DIAGNOSIS — D225 Melanocytic nevi of trunk: Secondary | ICD-10-CM | POA: Diagnosis not present

## 2015-12-16 DIAGNOSIS — D485 Neoplasm of uncertain behavior of skin: Secondary | ICD-10-CM | POA: Diagnosis not present

## 2015-12-16 DIAGNOSIS — D1801 Hemangioma of skin and subcutaneous tissue: Secondary | ICD-10-CM | POA: Diagnosis not present

## 2015-12-16 DIAGNOSIS — I788 Other diseases of capillaries: Secondary | ICD-10-CM | POA: Diagnosis not present

## 2015-12-16 DIAGNOSIS — L814 Other melanin hyperpigmentation: Secondary | ICD-10-CM | POA: Diagnosis not present

## 2016-02-21 ENCOUNTER — Other Ambulatory Visit: Payer: Self-pay | Admitting: Women's Health

## 2016-02-21 DIAGNOSIS — Z1231 Encounter for screening mammogram for malignant neoplasm of breast: Secondary | ICD-10-CM

## 2016-03-16 DIAGNOSIS — E559 Vitamin D deficiency, unspecified: Secondary | ICD-10-CM | POA: Diagnosis not present

## 2016-03-16 DIAGNOSIS — R7309 Other abnormal glucose: Secondary | ICD-10-CM | POA: Diagnosis not present

## 2016-03-16 DIAGNOSIS — N959 Unspecified menopausal and perimenopausal disorder: Secondary | ICD-10-CM | POA: Diagnosis not present

## 2016-03-16 DIAGNOSIS — I10 Essential (primary) hypertension: Secondary | ICD-10-CM | POA: Diagnosis not present

## 2016-03-16 DIAGNOSIS — E039 Hypothyroidism, unspecified: Secondary | ICD-10-CM | POA: Diagnosis not present

## 2016-03-17 ENCOUNTER — Ambulatory Visit
Admission: RE | Admit: 2016-03-17 | Discharge: 2016-03-17 | Disposition: A | Discharge status: Home / Self Care | Payer: BLUE CROSS/BLUE SHIELD | Source: Ambulatory Visit | Attending: Women's Health | Admitting: Women's Health

## 2016-03-17 DIAGNOSIS — Z1231 Encounter for screening mammogram for malignant neoplasm of breast: Secondary | ICD-10-CM | POA: Diagnosis not present

## 2016-03-21 ENCOUNTER — Encounter: Payer: Self-pay | Admitting: Women's Health

## 2016-04-06 DIAGNOSIS — N959 Unspecified menopausal and perimenopausal disorder: Secondary | ICD-10-CM | POA: Diagnosis not present

## 2016-04-06 DIAGNOSIS — E039 Hypothyroidism, unspecified: Secondary | ICD-10-CM | POA: Diagnosis not present

## 2016-04-06 DIAGNOSIS — N950 Postmenopausal bleeding: Secondary | ICD-10-CM | POA: Diagnosis not present

## 2016-04-06 DIAGNOSIS — I10 Essential (primary) hypertension: Secondary | ICD-10-CM | POA: Diagnosis not present

## 2016-04-13 ENCOUNTER — Other Ambulatory Visit: Payer: Self-pay | Admitting: Cardiovascular Disease

## 2016-04-13 DIAGNOSIS — I10 Essential (primary) hypertension: Secondary | ICD-10-CM | POA: Diagnosis not present

## 2016-04-13 DIAGNOSIS — I7300 Raynaud's syndrome without gangrene: Secondary | ICD-10-CM | POA: Diagnosis not present

## 2016-04-13 DIAGNOSIS — Z1159 Encounter for screening for other viral diseases: Secondary | ICD-10-CM | POA: Diagnosis not present

## 2016-04-13 NOTE — Telephone Encounter (Signed)
Please review for refill. Thanks!  

## 2016-04-13 NOTE — Telephone Encounter (Signed)
Rx(s) sent to pharmacy electronically.  

## 2016-05-11 DIAGNOSIS — I7300 Raynaud's syndrome without gangrene: Secondary | ICD-10-CM | POA: Diagnosis not present

## 2016-05-11 DIAGNOSIS — R946 Abnormal results of thyroid function studies: Secondary | ICD-10-CM | POA: Diagnosis not present

## 2016-05-11 DIAGNOSIS — I10 Essential (primary) hypertension: Secondary | ICD-10-CM | POA: Diagnosis not present

## 2016-07-04 ENCOUNTER — Encounter: Payer: Self-pay | Admitting: Women's Health

## 2016-07-04 ENCOUNTER — Ambulatory Visit (INDEPENDENT_AMBULATORY_CARE_PROVIDER_SITE_OTHER): Payer: BLUE CROSS/BLUE SHIELD | Admitting: Women's Health

## 2016-07-04 VITALS — BP 122/80 | Ht 66.0 in | Wt 129.0 lb

## 2016-07-04 DIAGNOSIS — Z01419 Encounter for gynecological examination (general) (routine) without abnormal findings: Secondary | ICD-10-CM

## 2016-07-04 NOTE — Progress Notes (Signed)
[name] Aug 31, 1960 [id number]    History:    Presents for annual exam.  Postmenopausal on HRT per primary care. Taking Prometrium daily and Vivelle 0.0375 patch twice weekly. Has occasional brown discharge without itching or odor. History of a negative endometrial biopsy 2016 for similar type symptoms. 2013 negative colonoscopy. Normal Pap and mammogram history. Has had to have follow-up ultrasounds with mammograms. Hypertension primary care manages labs and meds.  Past medical history, past surgical history, family history and social history were all reviewed and documented in the EPIC chart. Preschool teacher. [name] 18 starting [employer]. [name] 12 doing well. Parents hypertension, mother deceased from Alzheimer's.  ROS:  A ROS was performed and pertinent positives and negatives are included.  Exam:  Vitals:   07/04/16 0950  BP: 122/80  Weight: 129 lb (58.5 kg)  Height: 5\' 6"  (1.676 m)   Body mass index is 20.82 kg/m.   General appearance:  Normal Thyroid:  Symmetrical, normal in size, without palpable masses or nodularity. Respiratory  Auscultation:  Clear without wheezing or rhonchi Cardiovascular  Auscultation:  Regular rate, without rubs, murmurs or gallops  Edema/varicosities:  Not grossly evident Abdominal  Soft,nontender, without masses, guarding or rebound.  Liver/spleen:  No organomegaly noted  Hernia:  None appreciated  Skin  Inspection:  Grossly normal   Breasts: Examined lying and sitting.     Right: Without masses, retractions, discharge or axillary adenopathy.     Left: Without masses, retractions, discharge or axillary adenopathy. Gentitourinary   Inguinal/mons:  Normal without inguinal adenopathy  External genitalia:  Normal  BUS/Urethra/Skene's glands:  Normal  Vagina:  Normal  Cervix:  Normal  Uterus:  normal in size, shape and contour.  Midline and mobile  Adnexa/parametria:     Rt: Without masses or tenderness.   Lt: Without masses or  tenderness.  Anus and perineum: Normal  Digital rectal exam: Normal sphincter tone without palpated masses or tenderness  Assessment/Plan:  56 y.o. MWF G5 P1 +1 adopted for annual exam with no complaints.  Postmenopausal on HRT per primary care with occasional brown spotting Hypertension-primary care manages labs and meds  Plan: HRT reviewed risks of blood clots, strokes and breast cancer, reports feeling better since on. Instructed to call if breakthrough red bleeding or changes in occasional brown discharge. SBE's, continue annual 3-D screening mammogram history of dense breasts, calcium rich diet, vitamin D 2000 daily. DEXA. Continue regular exercise. Pap normal 2017, new screening guidelines reviewed.    [name] [facility], 11:02 AM 07/04/2016

## 2016-07-04 NOTE — Patient Instructions (Signed)
Health Maintenance for Postmenopausal Women Menopause is a normal process in which your reproductive ability comes to an end. This process happens gradually over a span of months to years, usually between the ages of 22 and 9. Menopause is complete when you have missed 12 consecutive menstrual periods. It is important to talk with your health care provider about some of the most common conditions that affect postmenopausal women, such as heart disease, cancer, and bone loss (osteoporosis). Adopting a healthy lifestyle and getting preventive care can help to promote your health and wellness. Those actions can also lower your chances of developing some of these common conditions. What should I know about menopause? During menopause, you may experience a number of symptoms, such as:  Moderate-to-severe hot flashes.  Night sweats.  Decrease in sex drive.  Mood swings.  Headaches.  Tiredness.  Irritability.  Memory problems.  Insomnia.  Choosing to treat or not to treat menopausal changes is an individual decision that you make with your health care provider. What should I know about hormone replacement therapy and supplements? Hormone therapy products are effective for treating symptoms that are associated with menopause, such as hot flashes and night sweats. Hormone replacement carries certain risks, especially as you become older. If you are thinking about using estrogen or estrogen with progestin treatments, discuss the benefits and risks with your health care provider. What should I know about heart disease and stroke? Heart disease, heart attack, and stroke become more likely as you age. This may be due, in part, to the hormonal changes that your body experiences during menopause. These can affect how your body processes dietary fats, triglycerides, and cholesterol. Heart attack and stroke are both medical emergencies. There are many things that you can do to help prevent heart disease  and stroke:  Have your blood pressure checked at least every 1-2 years. High blood pressure causes heart disease and increases the risk of stroke.  If you are 53-22 years old, ask your health care provider if you should take aspirin to prevent a heart attack or a stroke.  Do not use any tobacco products, including cigarettes, chewing tobacco, or electronic cigarettes. If you need help quitting, ask your health care provider.  It is important to eat a healthy diet and maintain a healthy weight. ? Be sure to include plenty of vegetables, fruits, low-fat dairy products, and lean protein. ? Avoid eating foods that are high in solid fats, added sugars, or salt (sodium).  Get regular exercise. This is one of the most important things that you can do for your health. ? Try to exercise for at least 150 minutes each week. The type of exercise that you do should increase your heart rate and make you sweat. This is known as moderate-intensity exercise. ? Try to do strengthening exercises at least twice each week. Do these in addition to the moderate-intensity exercise.  Know your numbers.Ask your health care provider to check your cholesterol and your blood glucose. Continue to have your blood tested as directed by your health care provider.  What should I know about cancer screening? There are several types of cancer. Take the following steps to reduce your risk and to catch any cancer development as early as possible. Breast Cancer  Practice breast self-awareness. ? This means understanding how your breasts normally appear and feel. ? It also means doing regular breast self-exams. Let your health care provider know about any changes, no matter how small.  If you are 40  or older, have a clinician do a breast exam (clinical breast exam or CBE) every year. Depending on your age, family history, and medical history, it may be recommended that you also have a yearly breast X-ray (mammogram).  If you  have a family history of breast cancer, talk with your health care provider about genetic screening.  If you are at high risk for breast cancer, talk with your health care provider about having an MRI and a mammogram every year.  Breast cancer (BRCA) gene test is recommended for women who have family members with BRCA-related cancers. Results of the assessment will determine the need for genetic counseling and BRCA1 and for BRCA2 testing. BRCA-related cancers include these types: ? Breast. This occurs in males or females. ? Ovarian. ? Tubal. This may also be called fallopian tube cancer. ? Cancer of the abdominal or pelvic lining (peritoneal cancer). ? Prostate. ? Pancreatic.  Cervical, Uterine, and Ovarian Cancer Your health care provider may recommend that you be screened regularly for cancer of the pelvic organs. These include your ovaries, uterus, and vagina. This screening involves a pelvic exam, which includes checking for microscopic changes to the surface of your cervix (Pap test).  For women ages 21-65, health care providers may recommend a pelvic exam and a Pap test every three years. For women ages 79-65, they may recommend the Pap test and pelvic exam, combined with testing for human papilloma virus (HPV), every five years. Some types of HPV increase your risk of cervical cancer. Testing for HPV may also be done on women of any age who have unclear Pap test results.  Other health care providers may not recommend any screening for nonpregnant women who are considered low risk for pelvic cancer and have no symptoms. Ask your health care provider if a screening pelvic exam is right for you.  If you have had past treatment for cervical cancer or a condition that could lead to cancer, you need Pap tests and screening for cancer for at least 20 years after your treatment. If Pap tests have been discontinued for you, your risk factors (such as having a new sexual partner) need to be  reassessed to determine if you should start having screenings again. Some women have medical problems that increase the chance of getting cervical cancer. In these cases, your health care provider may recommend that you have screening and Pap tests more often.  If you have a family history of uterine cancer or ovarian cancer, talk with your health care provider about genetic screening.  If you have vaginal bleeding after reaching menopause, tell your health care provider.  There are currently no reliable tests available to screen for ovarian cancer.  Lung Cancer Lung cancer screening is recommended for adults 69-62 years old who are at high risk for lung cancer because of a history of smoking. A yearly low-dose CT scan of the lungs is recommended if you:  Currently smoke.  Have a history of at least 30 pack-years of smoking and you currently smoke or have quit within the past 15 years. A pack-year is smoking an average of one pack of cigarettes per day for one year.  Yearly screening should:  Continue until it has been 15 years since you quit.  Stop if you develop a health problem that would prevent you from having lung cancer treatment.  Colorectal Cancer  This type of cancer can be detected and can often be prevented.  Routine colorectal cancer screening usually begins at  age 42 and continues through age 45.  If you have risk factors for colon cancer, your health care provider may recommend that you be screened at an earlier age.  If you have a family history of colorectal cancer, talk with your health care provider about genetic screening.  Your health care provider may also recommend using home test kits to check for hidden blood in your stool.  A small camera at the end of a tube can be used to examine your colon directly (sigmoidoscopy or colonoscopy). This is done to check for the earliest forms of colorectal cancer.  Direct examination of the colon should be repeated every  5-10 years until age 71. However, if early forms of precancerous polyps or small growths are found or if you have a family history or genetic risk for colorectal cancer, you may need to be screened more often.  Skin Cancer  Check your skin from head to toe regularly.  Monitor any moles. Be sure to tell your health care provider: ? About any new moles or changes in moles, especially if there is a change in a mole's shape or color. ? If you have a mole that is larger than the size of a pencil eraser.  If any of your family members has a history of skin cancer, especially at a young age, talk with your health care provider about genetic screening.  Always use sunscreen. Apply sunscreen liberally and repeatedly throughout the day.  Whenever you are outside, protect yourself by wearing long sleeves, pants, a wide-brimmed hat, and sunglasses.  What should I know about osteoporosis? Osteoporosis is a condition in which bone destruction happens more quickly than new bone creation. After menopause, you may be at an increased risk for osteoporosis. To help prevent osteoporosis or the bone fractures that can happen because of osteoporosis, the following is recommended:  If you are 46-71 years old, get at least 1,000 mg of calcium and at least 600 mg of vitamin D per day.  If you are older than age 55 but younger than age 65, get at least 1,200 mg of calcium and at least 600 mg of vitamin D per day.  If you are older than age 54, get at least 1,200 mg of calcium and at least 800 mg of vitamin D per day.  Smoking and excessive alcohol intake increase the risk of osteoporosis. Eat foods that are rich in calcium and vitamin D, and do weight-bearing exercises several times each week as directed by your health care provider. What should I know about how menopause affects my mental health? Depression may occur at any age, but it is more common as you become older. Common symptoms of depression  include:  Low or sad mood.  Changes in sleep patterns.  Changes in appetite or eating patterns.  Feeling an overall lack of motivation or enjoyment of activities that you previously enjoyed.  Frequent crying spells.  Talk with your health care provider if you think that you are experiencing depression. What should I know about immunizations? It is important that you get and maintain your immunizations. These include:  Tetanus, diphtheria, and pertussis (Tdap) booster vaccine.  Influenza every year before the flu season begins.  Pneumonia vaccine.  Shingles vaccine.  Your health care provider may also recommend other immunizations. This information is not intended to replace advice given to you by your health care provider. Make sure you discuss any questions you have with your health care provider. Document Released: 03/10/2005  Document Revised: 08/06/2015 Document Reviewed: 10/20/2014 Elsevier Interactive Patient Education  2018 [employer]vier Inc.  

## 2016-07-05 DIAGNOSIS — D2271 Melanocytic nevi of right lower limb, including hip: Secondary | ICD-10-CM | POA: Diagnosis not present

## 2016-07-05 DIAGNOSIS — B360 Pityriasis versicolor: Secondary | ICD-10-CM | POA: Diagnosis not present

## 2016-07-13 DIAGNOSIS — I10 Essential (primary) hypertension: Secondary | ICD-10-CM | POA: Diagnosis not present

## 2016-07-13 DIAGNOSIS — N950 Postmenopausal bleeding: Secondary | ICD-10-CM | POA: Diagnosis not present

## 2016-07-13 DIAGNOSIS — E039 Hypothyroidism, unspecified: Secondary | ICD-10-CM | POA: Diagnosis not present

## 2016-07-13 DIAGNOSIS — N959 Unspecified menopausal and perimenopausal disorder: Secondary | ICD-10-CM | POA: Diagnosis not present

## 2016-07-13 DIAGNOSIS — R7309 Other abnormal glucose: Secondary | ICD-10-CM | POA: Diagnosis not present

## 2016-08-14 ENCOUNTER — Other Ambulatory Visit: Payer: Self-pay | Admitting: Cardiovascular Disease

## 2016-08-14 NOTE — Telephone Encounter (Signed)
Please review for refill, Thanks !  

## 2016-08-15 NOTE — Telephone Encounter (Signed)
Rx has been sent to the pharmacy electronically. ° °

## 2016-08-21 ENCOUNTER — Other Ambulatory Visit: Payer: Self-pay | Admitting: Cardiovascular Disease

## 2016-08-21 NOTE — Telephone Encounter (Signed)
New message    Pt is calling.    *STAT* If patient is at the pharmacy, call can be transferred to refill team.   1. Which medications need to be refilled? (please list name of each medication and dose if known) lisinopril 10 mg  2. Which pharmacy/location (including street and city if local pharmacy) is medication to be sent to? [facility] on [address] in [place].  3. Do they need a 30 day or 90 day supply? 30 day

## 2016-08-21 NOTE — Telephone Encounter (Signed)
Returning your call from today. °

## 2016-08-21 NOTE — Telephone Encounter (Signed)
Lmtcb I was going to inform patient that rx was sent over last week, if patient calls back

## 2016-08-24 NOTE — Telephone Encounter (Signed)
Attempted to call patient no answer; left message advising patient to return call. Spoke with pharmacist at [facility] and she stated patient had 1 additional refill left on file. Patient does not need any refills at this time. Awaiting a call back from patient.

## 2016-08-25 NOTE — Telephone Encounter (Signed)
Patient called back and wanted to know if I could send her a refill Lisinopril, because she did not want to run out before her next appointment. Informed patient that I spoke with the pharmist at [facility] and the patient has plenty of refills left and when refills were needed we would sent them over. She voiced understanding.

## 2016-09-06 DIAGNOSIS — I10 Essential (primary) hypertension: Secondary | ICD-10-CM | POA: Diagnosis not present

## 2016-09-06 DIAGNOSIS — E041 Nontoxic single thyroid nodule: Secondary | ICD-10-CM | POA: Diagnosis not present

## 2016-09-06 DIAGNOSIS — E559 Vitamin D deficiency, unspecified: Secondary | ICD-10-CM | POA: Diagnosis not present

## 2016-09-06 DIAGNOSIS — R7309 Other abnormal glucose: Secondary | ICD-10-CM | POA: Diagnosis not present

## 2016-09-06 DIAGNOSIS — E039 Hypothyroidism, unspecified: Secondary | ICD-10-CM | POA: Diagnosis not present

## 2016-09-06 DIAGNOSIS — N959 Unspecified menopausal and perimenopausal disorder: Secondary | ICD-10-CM | POA: Diagnosis not present

## 2016-09-06 DIAGNOSIS — N950 Postmenopausal bleeding: Secondary | ICD-10-CM | POA: Diagnosis not present

## 2016-09-12 ENCOUNTER — Other Ambulatory Visit: Payer: Self-pay | Admitting: Cardiovascular Disease

## 2016-09-12 DIAGNOSIS — R7309 Other abnormal glucose: Secondary | ICD-10-CM | POA: Diagnosis not present

## 2016-09-12 DIAGNOSIS — N959 Unspecified menopausal and perimenopausal disorder: Secondary | ICD-10-CM | POA: Diagnosis not present

## 2016-09-12 DIAGNOSIS — E041 Nontoxic single thyroid nodule: Secondary | ICD-10-CM | POA: Diagnosis not present

## 2016-09-12 DIAGNOSIS — N950 Postmenopausal bleeding: Secondary | ICD-10-CM | POA: Diagnosis not present

## 2016-09-12 NOTE — Telephone Encounter (Signed)
Please review for refill, thanks ! 

## 2016-10-12 ENCOUNTER — Other Ambulatory Visit: Payer: Self-pay | Admitting: Cardiovascular Disease

## 2016-10-18 ENCOUNTER — Ambulatory Visit (INDEPENDENT_AMBULATORY_CARE_PROVIDER_SITE_OTHER): Payer: BLUE CROSS/BLUE SHIELD | Admitting: Cardiovascular Disease

## 2016-10-18 ENCOUNTER — Encounter: Payer: Self-pay | Admitting: Cardiovascular Disease

## 2016-10-18 VITALS — BP 118/82 | HR 71 | Ht 66.0 in | Wt 129.8 lb

## 2016-10-18 DIAGNOSIS — I10 Essential (primary) hypertension: Secondary | ICD-10-CM | POA: Diagnosis not present

## 2016-10-18 DIAGNOSIS — I6523 Occlusion and stenosis of bilateral carotid arteries: Secondary | ICD-10-CM

## 2016-10-18 MED ORDER — LISINOPRIL 10 MG PO TABS: 10.0000 mg | ORAL_TABLET | Freq: Every day | ORAL | 3 refills | Status: DC

## 2016-10-18 NOTE — Patient Instructions (Signed)
Medication Instructions:  Your physician recommends that you continue on your current medications as directed. Please refer to the Current Medication list given to you today.  Labwork: none  Testing/Procedures: Your physician has requested that you have a carotid duplex. This test is an ultrasound of the carotid arteries in your neck. It looks at blood flow through these arteries that supply the brain with blood. Allow one hour for this exam. There are no restrictions or special instructions.  Follow-Up: Your physician wants you to follow-up in: 1 year ov  You will receive a reminder letter in the mail two months in advance. If you don't receive a letter, please call our office to schedule the follow-up appointment.   If you need a refill on your cardiac medications before your next appointment, please call your pharmacy.

## 2016-10-18 NOTE — Progress Notes (Signed)
Cardiology Office Note   Date:  10/18/2016   ID:  Brittany Graham, Brittany Graham [birth year], MRN [id number]  PCP:  Ricke Hey, NP  Cardiologist:   Skeet Latch, MD   No chief complaint on file.    History of Present Illness: Brittany Graham is a 56 y.o. female with hypertension, mild carotid stenosis, and family history of premature CAD follow-up.   Brittany Graham reports first being diagnosed with hypertension in 2012.  She also had preeclampsia when pregnant.  He has previously taken Bystolic and HCTZ/lisinopril and remembers feeling poorly on Bystolic.  It was felt that her blood pressure was elevated due to a perimenopausal state and she subsequently stopped taking any medication.  Her blood pressure was okay for 2-3 years but then started to increase.  She was started on HCTZ/lisinopril but was only taking half the prescribed dose.  This was subsequently reduced to lisinopril 10 mg daily. She followed up with our pharmacist 10/2015 and her BP was well-controlled.  Brittany Graham sees an integrative medicine physician in [place]. She recommends that Brittany Graham avoid having a mammogram every year, but rather alternate them with a thermogram every other year.  Brittany Graham had her thermogram, which showed some inflammation in her right carotid artery.   she was then referred for carotid Dopplers 03/2015 that revealed mild bilateral carotid stenosis. She was started on aspirin 81 mg daily.   Since her last appointment Brittany Graham has been feeling well.  She walks for exercise 4 nights per week.  She has no chest pain or shortness of breath with this activity.  She has noted some discomfort in her R calf that is worse at night.  It typically occurs when she has been on her feet a lot.  She denies lower extremity edema, orthopnea or PND.  She does feel like her hands have been a little swollen for the last two days. She no longer checks her BP regularly because it has been well-controlled.    Past Medical  History:  Diagnosis Date  . Carotid stenosis 10/25/2015  . Hematuria, microscopic    HISTORY OF  . Hypertension 2008   ALSO,HX OF PREG INDUCED HYPERTENSION  . Spontaneous abortion    X 3    Past Surgical History:  Procedure Laterality Date  . DILATION AND CURETTAGE OF UTERUS       Current Outpatient Prescriptions  Medication Sig Dispense Refill  . aspirin EC 81 MG tablet Take 1 tablet (81 mg total) by mouth daily. 90 tablet 3  . Cholecalciferol 5000 units TABS Take by mouth once.    Marland Kitchen estradiol (VIVELLE-DOT) 0.0375 MG/24HR as directed.  3  . fish oil-omega-3 fatty acids 1000 MG capsule Take 2 g by mouth daily.      Marland Kitchen MAGNESIUM CITRATE PO Take 100 mg by mouth 2 (two) times daily.    . Multiple Vitamin (MULTIVITAMIN PO) Take by mouth.      Marland Kitchen NATURE-THROID 65 MG tablet Take 1 tablet by mouth daily.    . progesterone (PROMETRIUM) 100 MG capsule Take 100 mg by mouth 2 (two) times daily.     Marland Kitchen lisinopril (PRINIVIL,ZESTRIL) 10 MG tablet Take 1 tablet (10 mg total) by mouth daily. 90 tablet 3   No current facility-administered medications for this visit.     Allergies:   Codeine and Pindolol    Social History:  The Brittany Graham  reports that she has never smoked. She has never used smokeless tobacco. She  reports that she drinks alcohol. She reports that she does not use drugs.   Family History:  The Brittany Graham's family history includes CAD in her father; CAD (age of onset: 69) in her sister; CAD (age of onset: 45) in her sister; Cancer (age of onset: 46) in her maternal grandmother; Colon polyps in her sister and sister; Dementia in her mother; Heart attack in her sister and sister; Heart disease in her father; Hypertension in her father and mother.    ROS:  Please see the history of present illness.   Otherwise, review of systems are positive for occasional palpitations at night and fatigue.   All other systems are reviewed and negative.    PHYSICAL EXAM: VS:  BP 118/82   Pulse 71   Ht  5\' 6"  (1.676 m)   Wt 58.9 kg (129 lb 12.8 oz)   BMI 20.95 kg/m  , BMI Body mass index is 20.95 kg/m. GENERAL:  Well appearing HEENT: Pupils equal round and reactive, fundi not visualized, oral mucosa unremarkable NECK:  No jugular venous distention, waveform within normal limits, carotid upstroke brisk and symmetric, no bruits, no thyromegaly LUNGS:  Clear to auscultation bilaterally HEART:  RRR.  PMI not displaced or sustained,S1 and S2 within normal limits, no S3, no S4, no clicks, no rubs, no murmurs ABD:  Flat, positive bowel sounds normal in frequency in pitch, no bruits, no rebound, no guarding, no midline pulsatile mass, no hepatomegaly, no splenomegaly EXT:  2 plus pulses throughout, no edema, no cyanosis no clubbing SKIN:  No rashes no nodules NEURO:  Cranial nerves II through XII grossly intact, motor grossly intact throughout PSYCH:  Cognitively intact, oriented to person place and time  EKG:  EKG is ordered today. The ekg ordered 10/25/15 demonstrates sinus rhythm rate 70 bpm.   10/18/16: Sinus rhythm.  Rate 71 bpm.    Carotid Dopplers 04/02/15:1-39% ICA stenosis bilaterally.  Recent Labs: No results found for requested labs within last 8760 hours.    Lipid Panel    Component Value Date/Time   CHOL 177 03/30/2015 0836   TRIG 63 03/30/2015 0836   HDL 65 03/30/2015 0836   CHOLHDL 2.7 03/30/2015 0836   VLDL 13 03/30/2015 0836   LDLCALC 99 03/30/2015 0836      Wt Readings from Last 3 Encounters:  10/18/16 58.9 kg (129 lb 12.8 oz)  07/04/16 58.5 kg (129 lb)  10/25/15 59.1 kg (130 lb 3.2 oz)      ASSESSMENT AND PLAN:  # Hypertension: Brittany Graham blood pressure is well-controlled on lisinopril.   # Carotid stenosis: Brittany Graham has mild bilateral stenosis.  LDL is 99.  Continue aspirin 81 mg daily.  Repeat carotid Dopplers.    Current medicines are reviewed at length with the Brittany Graham today.  The Brittany Graham does not have concerns regarding medicines.  The following  changes have been made:  none Labs/ tests ordered today include:   Orders Placed This Encounter  Procedures  . EKG 12-Lead     Disposition:   FU with Kaija Kovacevic C. Oval Linsey, MD, [facility] in 1 year.   This note was written with the assistance of speech recognition software.  Please excuse any transcriptional errors.  Signed, Chanell Nadeau C. Oval Linsey, MD, [facility]  10/18/2016 2:34 PM    [place]

## 2016-11-07 ENCOUNTER — Ambulatory Visit (HOSPITAL_COMMUNITY)
Admission: RE | Admit: 2016-11-07 | Discharge: 2016-11-07 | Disposition: A | Discharge status: Home / Self Care | Payer: BLUE CROSS/BLUE SHIELD | Source: Ambulatory Visit | Attending: Cardiovascular Disease | Admitting: Cardiovascular Disease

## 2016-11-07 DIAGNOSIS — I10 Essential (primary) hypertension: Secondary | ICD-10-CM | POA: Diagnosis not present

## 2016-11-07 DIAGNOSIS — I6523 Occlusion and stenosis of bilateral carotid arteries: Secondary | ICD-10-CM

## 2016-11-29 DIAGNOSIS — M9903 Segmental and somatic dysfunction of lumbar region: Secondary | ICD-10-CM | POA: Diagnosis not present

## 2016-11-29 DIAGNOSIS — M5413 Radiculopathy, cervicothoracic region: Secondary | ICD-10-CM | POA: Diagnosis not present

## 2017-01-29 ENCOUNTER — Emergency Department (HOSPITAL_BASED_OUTPATIENT_CLINIC_OR_DEPARTMENT_OTHER)
Admission: EM | Admit: 2017-01-29 | Discharge: 2017-01-30 | Disposition: A | Discharge status: Home / Self Care | Payer: BLUE CROSS/BLUE SHIELD | Attending: Emergency Medicine | Admitting: Emergency Medicine

## 2017-01-29 ENCOUNTER — Other Ambulatory Visit: Payer: Self-pay

## 2017-01-29 ENCOUNTER — Encounter (HOSPITAL_BASED_OUTPATIENT_CLINIC_OR_DEPARTMENT_OTHER): Payer: Self-pay | Admitting: *Deleted

## 2017-01-29 DIAGNOSIS — Z5321 Procedure and treatment not carried out due to patient leaving prior to being seen by health care provider: Secondary | ICD-10-CM | POA: Diagnosis not present

## 2017-01-29 DIAGNOSIS — R1110 Vomiting, unspecified: Secondary | ICD-10-CM | POA: Diagnosis not present

## 2017-01-29 DIAGNOSIS — R197 Diarrhea, unspecified: Secondary | ICD-10-CM | POA: Insufficient documentation

## 2017-01-29 LAB — COMPREHENSIVE METABOLIC PANEL
ALBUMIN: 4.2 g/dL (ref 3.5–5.0)
ALK PHOS: 61 U/L (ref 38–126)
ALT: 19 U/L (ref 14–54)
ANION GAP: 9 (ref 5–15)
AST: 29 U/L (ref 15–41)
BUN: 17 mg/dL (ref 6–20)
CALCIUM: 9.1 mg/dL (ref 8.9–10.3)
CO2: 26 mmol/L (ref 22–32)
Chloride: 104 mmol/L (ref 101–111)
Creatinine, Ser: 0.81 mg/dL (ref 0.44–1.00)
GFR calc Af Amer: >60 mL/min (ref >60)
GFR calc non Af Amer: >60 mL/min (ref >60)
GLUCOSE: 140 mg/dL — AB (ref 65–99)
POTASSIUM: 3.9 mmol/L (ref 3.5–5.1)
SODIUM: 139 mmol/L (ref 135–145)
Total Bilirubin: 0.8 mg/dL (ref 0.3–1.2)
Total Protein: 7.5 g/dL (ref 6.5–8.1)

## 2017-01-29 LAB — CBC WITH DIFFERENTIAL/PLATELET
Basophils Absolute: 0 10*3/uL (ref 0.0–0.1)
Basophils Relative: 0 %
Eosinophils Absolute: 0 10*3/uL (ref 0.0–0.7)
Eosinophils Relative: 0 %
HEMATOCRIT: 46.2 % — AB (ref 36.0–46.0)
Hemoglobin: 15.5 g/dL — ABNORMAL HIGH (ref 12.0–15.0)
LYMPHS PCT: 2 %
Lymphs Abs: 0.3 10*3/uL — ABNORMAL LOW (ref 0.7–4.0)
MCH: 29.6 pg (ref 26.0–34.0)
MCHC: 33.5 g/dL (ref 30.0–36.0)
MCV: 88.3 fL (ref 78.0–100.0)
MONO ABS: 0.4 10*3/uL (ref 0.1–1.0)
MONOS PCT: 3 %
NEUTROS ABS: 12.6 10*3/uL — AB (ref 1.7–7.7)
Neutrophils Relative %: 95 %
Platelets: 292 10*3/uL (ref 150–400)
RBC: 5.23 MIL/uL — ABNORMAL HIGH (ref 3.87–5.11)
RDW: 13.2 % (ref 11.5–15.5)
WBC: 13.4 10*3/uL — ABNORMAL HIGH (ref 4.0–10.5)

## 2017-01-29 LAB — URINALYSIS, ROUTINE W REFLEX MICROSCOPIC
Bilirubin Urine: NEGATIVE
GLUCOSE, UA: NEGATIVE mg/dL
Ketones, ur: 15 mg/dL — AB
LEUKOCYTES UA: NEGATIVE
Nitrite: NEGATIVE
PH: 7 (ref 5.0–8.0)
Protein, ur: NEGATIVE mg/dL
Specific Gravity, Urine: 1.02 (ref 1.005–1.030)

## 2017-01-29 LAB — URINALYSIS, MICROSCOPIC (REFLEX)

## 2017-01-29 LAB — LIPASE, BLOOD: Lipase: 26 U/L (ref 11–51)

## 2017-01-29 NOTE — ED Triage Notes (Addendum)
Diarrhea x 2 this am. Vomited x 6 since this afternoon.

## 2017-01-30 DIAGNOSIS — R197 Diarrhea, unspecified: Secondary | ICD-10-CM | POA: Diagnosis not present

## 2017-01-30 DIAGNOSIS — R109 Unspecified abdominal pain: Secondary | ICD-10-CM | POA: Diagnosis not present

## 2017-01-30 DIAGNOSIS — Z888 Allergy status to other drugs, medicaments and biological substances status: Secondary | ICD-10-CM | POA: Diagnosis not present

## 2017-01-30 DIAGNOSIS — Z79899 Other long term (current) drug therapy: Secondary | ICD-10-CM | POA: Diagnosis not present

## 2017-01-30 DIAGNOSIS — R112 Nausea with vomiting, unspecified: Secondary | ICD-10-CM | POA: Diagnosis not present

## 2017-01-30 DIAGNOSIS — E079 Disorder of thyroid, unspecified: Secondary | ICD-10-CM | POA: Diagnosis not present

## 2017-01-30 DIAGNOSIS — D259 Leiomyoma of uterus, unspecified: Secondary | ICD-10-CM | POA: Diagnosis not present

## 2017-01-30 DIAGNOSIS — Z885 Allergy status to narcotic agent status: Secondary | ICD-10-CM | POA: Diagnosis not present

## 2017-01-30 DIAGNOSIS — R1012 Left upper quadrant pain: Secondary | ICD-10-CM | POA: Diagnosis not present

## 2017-01-30 DIAGNOSIS — I10 Essential (primary) hypertension: Secondary | ICD-10-CM | POA: Diagnosis not present

## 2017-01-30 DIAGNOSIS — R1110 Vomiting, unspecified: Secondary | ICD-10-CM | POA: Diagnosis not present

## 2017-01-30 DIAGNOSIS — R9431 Abnormal electrocardiogram [ECG] [EKG]: Secondary | ICD-10-CM | POA: Diagnosis not present

## 2017-01-30 DIAGNOSIS — R1011 Right upper quadrant pain: Secondary | ICD-10-CM | POA: Diagnosis not present

## 2017-01-30 DIAGNOSIS — N281 Cyst of kidney, acquired: Secondary | ICD-10-CM | POA: Diagnosis not present

## 2017-01-30 NOTE — ED Notes (Signed)
Vitals unable to be obtained because of pt wanting to leave before being discharged. Pt/family refused.

## 2017-01-30 NOTE — ED Notes (Signed)
Pts husband came out stating he needed his wifes IV taking out he is taking her to [place]. Pt educated on the process here and on leaving AMA; pt verbalized understanding

## 2017-02-01 DIAGNOSIS — E860 Dehydration: Secondary | ICD-10-CM | POA: Diagnosis not present

## 2017-02-01 DIAGNOSIS — K529 Noninfective gastroenteritis and colitis, unspecified: Secondary | ICD-10-CM | POA: Diagnosis not present

## 2017-02-01 DIAGNOSIS — Z09 Encounter for follow-up examination after completed treatment for conditions other than malignant neoplasm: Secondary | ICD-10-CM | POA: Diagnosis not present

## 2017-02-23 ENCOUNTER — Other Ambulatory Visit: Payer: Self-pay | Admitting: Nurse Practitioner

## 2017-02-23 DIAGNOSIS — Z1231 Encounter for screening mammogram for malignant neoplasm of breast: Secondary | ICD-10-CM

## 2017-03-01 DIAGNOSIS — L84 Corns and callosities: Secondary | ICD-10-CM | POA: Diagnosis not present

## 2017-03-01 DIAGNOSIS — D485 Neoplasm of uncertain behavior of skin: Secondary | ICD-10-CM | POA: Diagnosis not present

## 2017-03-01 DIAGNOSIS — D2271 Melanocytic nevi of right lower limb, including hip: Secondary | ICD-10-CM | POA: Diagnosis not present

## 2017-03-01 DIAGNOSIS — D225 Melanocytic nevi of trunk: Secondary | ICD-10-CM | POA: Diagnosis not present

## 2017-03-01 DIAGNOSIS — L821 Other seborrheic keratosis: Secondary | ICD-10-CM | POA: Diagnosis not present

## 2017-03-01 DIAGNOSIS — D2262 Melanocytic nevi of left upper limb, including shoulder: Secondary | ICD-10-CM | POA: Diagnosis not present

## 2017-03-01 DIAGNOSIS — D2261 Melanocytic nevi of right upper limb, including shoulder: Secondary | ICD-10-CM | POA: Diagnosis not present

## 2017-03-01 DIAGNOSIS — L814 Other melanin hyperpigmentation: Secondary | ICD-10-CM | POA: Diagnosis not present

## 2017-03-15 DIAGNOSIS — E039 Hypothyroidism, unspecified: Secondary | ICD-10-CM | POA: Diagnosis not present

## 2017-03-15 DIAGNOSIS — E559 Vitamin D deficiency, unspecified: Secondary | ICD-10-CM | POA: Diagnosis not present

## 2017-03-15 DIAGNOSIS — I10 Essential (primary) hypertension: Secondary | ICD-10-CM | POA: Diagnosis not present

## 2017-03-15 DIAGNOSIS — N951 Menopausal and female climacteric states: Secondary | ICD-10-CM | POA: Diagnosis not present

## 2017-03-15 DIAGNOSIS — R7309 Other abnormal glucose: Secondary | ICD-10-CM | POA: Diagnosis not present

## 2017-03-19 DIAGNOSIS — I6529 Occlusion and stenosis of unspecified carotid artery: Secondary | ICD-10-CM | POA: Diagnosis not present

## 2017-03-19 DIAGNOSIS — J329 Chronic sinusitis, unspecified: Secondary | ICD-10-CM | POA: Diagnosis not present

## 2017-03-19 DIAGNOSIS — I10 Essential (primary) hypertension: Secondary | ICD-10-CM | POA: Diagnosis not present

## 2017-03-20 ENCOUNTER — Ambulatory Visit: Payer: BLUE CROSS/BLUE SHIELD

## 2017-03-21 DIAGNOSIS — I10 Essential (primary) hypertension: Secondary | ICD-10-CM | POA: Diagnosis not present

## 2017-03-22 DIAGNOSIS — E041 Nontoxic single thyroid nodule: Secondary | ICD-10-CM | POA: Diagnosis not present

## 2017-03-22 DIAGNOSIS — N959 Unspecified menopausal and perimenopausal disorder: Secondary | ICD-10-CM | POA: Diagnosis not present

## 2017-03-22 DIAGNOSIS — R7309 Other abnormal glucose: Secondary | ICD-10-CM | POA: Diagnosis not present

## 2017-03-22 DIAGNOSIS — I10 Essential (primary) hypertension: Secondary | ICD-10-CM | POA: Diagnosis not present

## 2017-03-29 ENCOUNTER — Ambulatory Visit
Admission: RE | Admit: 2017-03-29 | Discharge: 2017-03-29 | Disposition: A | Discharge status: Home / Self Care | Payer: BLUE CROSS/BLUE SHIELD | Source: Ambulatory Visit | Attending: Nurse Practitioner | Admitting: Nurse Practitioner

## 2017-03-29 DIAGNOSIS — Z1231 Encounter for screening mammogram for malignant neoplasm of breast: Secondary | ICD-10-CM | POA: Diagnosis not present

## 2017-03-30 ENCOUNTER — Encounter: Payer: Self-pay | Admitting: Women's Health

## 2017-07-12 DIAGNOSIS — E039 Hypothyroidism, unspecified: Secondary | ICD-10-CM | POA: Diagnosis not present

## 2017-07-12 DIAGNOSIS — E042 Nontoxic multinodular goiter: Secondary | ICD-10-CM | POA: Diagnosis not present

## 2017-09-12 ENCOUNTER — Encounter: Payer: BLUE CROSS/BLUE SHIELD | Admitting: Women's Health

## 2017-10-09 ENCOUNTER — Ambulatory Visit: Payer: BLUE CROSS/BLUE SHIELD | Admitting: Women's Health

## 2017-10-09 ENCOUNTER — Encounter: Payer: Self-pay | Admitting: Women's Health

## 2017-10-09 VITALS — BP 126/80 | Ht 66.0 in | Wt 132.0 lb

## 2017-10-09 DIAGNOSIS — Z01419 Encounter for gynecological examination (general) (routine) without abnormal findings: Secondary | ICD-10-CM | POA: Diagnosis not present

## 2017-10-09 DIAGNOSIS — Z13820 Encounter for screening for osteoporosis: Secondary | ICD-10-CM | POA: Diagnosis not present

## 2017-10-09 DIAGNOSIS — N938 Other specified abnormal uterine and vaginal bleeding: Secondary | ICD-10-CM | POA: Diagnosis not present

## 2017-10-09 MED ORDER — PROGESTERONE MICRONIZED 100 MG PO CAPS: 100.0000 mg | ORAL_CAPSULE | Freq: Every day | ORAL | 4 refills | Status: DC

## 2017-10-09 MED ORDER — ESTRADIOL 0.0375 MG/24HR TD PTTW: 1.0000 | MEDICATED_PATCH | TRANSDERMAL | 4 refills | Status: DC

## 2017-10-09 NOTE — Progress Notes (Signed)
Brittany Graham [birth year] [id number]    History:    Presents for annual exam.  Postmenopausal on HRT with occasional spotting.  2013- colonoscopy.  Normal Pap and mammogram history.  Has not had a screening DEXA.  Past medical history, past surgical history, family history and social history were all reviewed and documented in the EPIC chart.  Preschool teacher.  Judson Roch 19 at [employer] doing well, Stanton Kidney 12 in eighth grade doing well also.  Parents hypertension, mother deceased from Alzheimer's.  ROS:  A ROS was performed and pertinent positives and negatives are included.  Exam:  Vitals:   10/09/17 1111  BP: 126/80  Weight: 132 lb (59.9 kg)  Height: 5\' 6"  (1.676 m)   Body mass index is 21.31 kg/m.   General appearance:  Normal Thyroid:  Symmetrical, normal in size, without palpable masses or nodularity. Respiratory  Auscultation:  Clear without wheezing or rhonchi Cardiovascular  Auscultation:  Regular rate, without rubs, murmurs or gallops  Edema/varicosities:  Not grossly evident Abdominal  Soft,nontender, without masses, guarding or rebound.  Liver/spleen:  No organomegaly noted  Hernia:  None appreciated  Skin  Inspection:  Grossly normal   Breasts: Examined lying and sitting.     Right: Without masses, retractions, discharge or axillary adenopathy.     Left: Without masses, retractions, discharge or axillary adenopathy. Gentitourinary   Inguinal/mons:  Normal without inguinal adenopathy  External genitalia:  Normal  BUS/Urethra/Skene's glands:  Normal  Vagina:  Normal  Cervix:  Normal  Uterus: normal in size, shape and contour.  Midline and mobile  Adnexa/parametria:     Rt: Without masses or tenderness.   Lt: Without masses or tenderness.  Anus and perineum: Normal  Digital rectal exam: Normal sphincter tone without palpated masses or tenderness  Assessment/Plan:  57 y.o. MWF G5, P1 +1 adopted for annual exam with no complaints.  Postmenopausal on  HRT with occasional spotting Hypertension-primary care manages labs and meds  Plan: HRT reviewed risks of blood clots, strokes and breast cancer, best to use only 6 to 7 years, continues with vaginal hot flashes, Vivelle patch 0.0375 twice weekly and Prometrium 100 mg at bedtime daily, instructed to call if further bleeding we will get an ultrasound to check endometrial thickness.  SBE's, continue annual screening mammogram, calcium rich foods, vitamin D 2000 daily encouraged.  Schedule DEXA.  Reviewed importance of exercise, balance type as well as weightbearing, home safety and fall prevention discussed.    [place], 12:56 PM 10/09/2017

## 2017-10-09 NOTE — Patient Instructions (Signed)
Health Maintenance for Postmenopausal Women Menopause is a normal process in which your reproductive ability comes to an end. This process happens gradually over a span of months to years, usually between the ages of 22 and 9. Menopause is complete when you have missed 12 consecutive menstrual periods. It is important to talk with your health care provider about some of the most common conditions that affect postmenopausal women, such as heart disease, cancer, and bone loss (osteoporosis). Adopting a healthy lifestyle and getting preventive care can help to promote your health and wellness. Those actions can also lower your chances of developing some of these common conditions. What should I know about menopause? During menopause, you may experience a number of symptoms, such as:  Moderate-to-severe hot flashes.  Night sweats.  Decrease in sex drive.  Mood swings.  Headaches.  Tiredness.  Irritability.  Memory problems.  Insomnia.  Choosing to treat or not to treat menopausal changes is an individual decision that you make with your health care provider. What should I know about hormone replacement therapy and supplements? Hormone therapy products are effective for treating symptoms that are associated with menopause, such as hot flashes and night sweats. Hormone replacement carries certain risks, especially as you become older. If you are thinking about using estrogen or estrogen with progestin treatments, discuss the benefits and risks with your health care provider. What should I know about heart disease and stroke? Heart disease, heart attack, and stroke become more likely as you age. This may be due, in part, to the hormonal changes that your body experiences during menopause. These can affect how your body processes dietary fats, triglycerides, and cholesterol. Heart attack and stroke are both medical emergencies. There are many things that you can do to help prevent heart disease  and stroke:  Have your blood pressure checked at least every 1-2 years. High blood pressure causes heart disease and increases the risk of stroke.  If you are 53-22 years old, ask your health care provider if you should take aspirin to prevent a heart attack or a stroke.  Do not use any tobacco products, including cigarettes, chewing tobacco, or electronic cigarettes. If you need help quitting, ask your health care provider.  It is important to eat a healthy diet and maintain a healthy weight. ? Be sure to include plenty of vegetables, fruits, low-fat dairy products, and lean protein. ? Avoid eating foods that are high in solid fats, added sugars, or salt (sodium).  Get regular exercise. This is one of the most important things that you can do for your health. ? Try to exercise for at least 150 minutes each week. The type of exercise that you do should increase your heart rate and make you sweat. This is known as moderate-intensity exercise. ? Try to do strengthening exercises at least twice each week. Do these in addition to the moderate-intensity exercise.  Know your numbers.Ask your health care provider to check your cholesterol and your blood glucose. Continue to have your blood tested as directed by your health care provider.  What should I know about cancer screening? There are several types of cancer. Take the following steps to reduce your risk and to catch any cancer development as early as possible. Breast Cancer  Practice breast self-awareness. ? This means understanding how your breasts normally appear and feel. ? It also means doing regular breast self-exams. Let your health care provider know about any changes, no matter how small.  If you are 40  or older, have a clinician do a breast exam (clinical breast exam or CBE) every year. Depending on your age, family history, and medical history, it may be recommended that you also have a yearly breast X-ray (mammogram).  If you  have a family history of breast cancer, talk with your health care provider about genetic screening.  If you are at high risk for breast cancer, talk with your health care provider about having an MRI and a mammogram every year.  Breast cancer (BRCA) gene test is recommended for women who have family members with BRCA-related cancers. Results of the assessment will determine the need for genetic counseling and BRCA1 and for BRCA2 testing. BRCA-related cancers include these types: ? Breast. This occurs in males or females. ? Ovarian. ? Tubal. This may also be called fallopian tube cancer. ? Cancer of the abdominal or pelvic lining (peritoneal cancer). ? Prostate. ? Pancreatic.  Cervical, Uterine, and Ovarian Cancer Your health care provider may recommend that you be screened regularly for cancer of the pelvic organs. These include your ovaries, uterus, and vagina. This screening involves a pelvic exam, which includes checking for microscopic changes to the surface of your cervix (Pap test).  For women ages 21-65, health care providers may recommend a pelvic exam and a Pap test every three years. For women ages 79-65, they may recommend the Pap test and pelvic exam, combined with testing for human papilloma virus (HPV), every five years. Some types of HPV increase your risk of cervical cancer. Testing for HPV may also be done on women of any age who have unclear Pap test results.  Other health care providers may not recommend any screening for nonpregnant women who are considered low risk for pelvic cancer and have no symptoms. Ask your health care provider if a screening pelvic exam is right for you.  If you have had past treatment for cervical cancer or a condition that could lead to cancer, you need Pap tests and screening for cancer for at least 20 years after your treatment. If Pap tests have been discontinued for you, your risk factors (such as having a new sexual partner) need to be  reassessed to determine if you should start having screenings again. Some women have medical problems that increase the chance of getting cervical cancer. In these cases, your health care provider may recommend that you have screening and Pap tests more often.  If you have a family history of uterine cancer or ovarian cancer, talk with your health care provider about genetic screening.  If you have vaginal bleeding after reaching menopause, tell your health care provider.  There are currently no reliable tests available to screen for ovarian cancer.  Lung Cancer Lung cancer screening is recommended for adults 69-62 years old who are at high risk for lung cancer because of a history of smoking. A yearly low-dose CT scan of the lungs is recommended if you:  Currently smoke.  Have a history of at least 30 pack-years of smoking and you currently smoke or have quit within the past 15 years. A pack-year is smoking an average of one pack of cigarettes per day for one year.  Yearly screening should:  Continue until it has been 15 years since you quit.  Stop if you develop a health problem that would prevent you from having lung cancer treatment.  Colorectal Cancer  This type of cancer can be detected and can often be prevented.  Routine colorectal cancer screening usually begins at  age 42 and continues through age 45.  If you have risk factors for colon cancer, your health care provider may recommend that you be screened at an earlier age.  If you have a family history of colorectal cancer, talk with your health care provider about genetic screening.  Your health care provider may also recommend using home test kits to check for hidden blood in your stool.  A small camera at the end of a tube can be used to examine your colon directly (sigmoidoscopy or colonoscopy). This is done to check for the earliest forms of colorectal cancer.  Direct examination of the colon should be repeated every  5-10 years until age 71. However, if early forms of precancerous polyps or small growths are found or if you have a family history or genetic risk for colorectal cancer, you may need to be screened more often.  Skin Cancer  Check your skin from head to toe regularly.  Monitor any moles. Be sure to tell your health care provider: ? About any new moles or changes in moles, especially if there is a change in a mole's shape or color. ? If you have a mole that is larger than the size of a pencil eraser.  If any of your family members has a history of skin cancer, especially at a Briggett Tuccillo age, talk with your health care provider about genetic screening.  Always use sunscreen. Apply sunscreen liberally and repeatedly throughout the day.  Whenever you are outside, protect yourself by wearing long sleeves, pants, a wide-brimmed hat, and sunglasses.  What should I know about osteoporosis? Osteoporosis is a condition in which bone destruction happens more quickly than new bone creation. After menopause, you may be at an increased risk for osteoporosis. To help prevent osteoporosis or the bone fractures that can happen because of osteoporosis, the following is recommended:  If you are 46-71 years old, get at least 1,000 mg of calcium and at least 600 mg of vitamin D per day.  If you are older than age 55 but younger than age 65, get at least 1,200 mg of calcium and at least 600 mg of vitamin D per day.  If you are older than age 54, get at least 1,200 mg of calcium and at least 800 mg of vitamin D per day.  Smoking and excessive alcohol intake increase the risk of osteoporosis. Eat foods that are rich in calcium and vitamin D, and do weight-bearing exercises several times each week as directed by your health care provider. What should I know about how menopause affects my mental health? Depression may occur at any age, but it is more common as you become older. Common symptoms of depression  include:  Low or sad mood.  Changes in sleep patterns.  Changes in appetite or eating patterns.  Feeling an overall lack of motivation or enjoyment of activities that you previously enjoyed.  Frequent crying spells.  Talk with your health care provider if you think that you are experiencing depression. What should I know about immunizations? It is important that you get and maintain your immunizations. These include:  Tetanus, diphtheria, and pertussis (Tdap) booster vaccine.  Influenza every year before the flu season begins.  Pneumonia vaccine.  Shingles vaccine.  Your health care provider may also recommend other immunizations. This information is not intended to replace advice given to you by your health care provider. Make sure you discuss any questions you have with your health care provider. Document Released: 03/10/2005  Document Revised: 08/06/2015 Document Reviewed: 10/20/2014 Elsevier Interactive Patient Education  2018 Elsevier Inc.  

## 2017-10-10 ENCOUNTER — Other Ambulatory Visit: Payer: Self-pay | Admitting: Cardiovascular Disease

## 2017-10-16 ENCOUNTER — Ambulatory Visit: Payer: BLUE CROSS/BLUE SHIELD | Admitting: Women's Health

## 2017-10-16 ENCOUNTER — Other Ambulatory Visit: Payer: BLUE CROSS/BLUE SHIELD

## 2017-11-09 ENCOUNTER — Other Ambulatory Visit: Payer: Self-pay | Admitting: Cardiovascular Disease

## 2017-11-09 ENCOUNTER — Other Ambulatory Visit: Payer: Self-pay

## 2017-11-23 DIAGNOSIS — L821 Other seborrheic keratosis: Secondary | ICD-10-CM | POA: Diagnosis not present

## 2017-11-23 DIAGNOSIS — L438 Other lichen planus: Secondary | ICD-10-CM | POA: Diagnosis not present

## 2017-12-09 ENCOUNTER — Other Ambulatory Visit: Payer: Self-pay | Admitting: Cardiovascular Disease

## 2017-12-13 DIAGNOSIS — I6529 Occlusion and stenosis of unspecified carotid artery: Secondary | ICD-10-CM | POA: Diagnosis not present

## 2017-12-13 DIAGNOSIS — I10 Essential (primary) hypertension: Secondary | ICD-10-CM | POA: Diagnosis not present

## 2017-12-19 DIAGNOSIS — E0510 Thyrotoxicosis with toxic single thyroid nodule without thyrotoxic crisis or storm: Secondary | ICD-10-CM | POA: Diagnosis not present

## 2018-02-14 ENCOUNTER — Telehealth: Payer: Self-pay | Admitting: *Deleted

## 2018-02-14 NOTE — Telephone Encounter (Signed)
Left detailed message on cell per DPR access. 

## 2018-02-14 NOTE — Telephone Encounter (Signed)
[place] anytime IF spotting persisted, ok if not spotting or bleeding with [place]. She is on HRT.  Have her schedule, thanks

## 2018-02-14 NOTE — Telephone Encounter (Signed)
Patient called and left voicemail stating you wanted her to have ultrasound if bleeding/spotting continued to check endometrial thickness. Patient said based on her work schedule, your schedule she hasn't been able to schedule. She mentioned you want her to bled for ultrasound? Patient said the bleeding varies sometimes 1 days of spotting, or only wiping, never a flow. Does she just schedule ultrasound regarding of bleeding? Please advise

## 2018-02-19 DIAGNOSIS — N959 Unspecified menopausal and perimenopausal disorder: Secondary | ICD-10-CM | POA: Diagnosis not present

## 2018-02-19 DIAGNOSIS — R739 Hyperglycemia, unspecified: Secondary | ICD-10-CM | POA: Diagnosis not present

## 2018-02-19 DIAGNOSIS — E039 Hypothyroidism, unspecified: Secondary | ICD-10-CM | POA: Diagnosis not present

## 2018-02-19 DIAGNOSIS — I10 Essential (primary) hypertension: Secondary | ICD-10-CM | POA: Diagnosis not present

## 2018-02-22 DIAGNOSIS — N950 Postmenopausal bleeding: Secondary | ICD-10-CM | POA: Diagnosis not present

## 2018-02-22 DIAGNOSIS — E041 Nontoxic single thyroid nodule: Secondary | ICD-10-CM | POA: Diagnosis not present

## 2018-02-22 DIAGNOSIS — R7309 Other abnormal glucose: Secondary | ICD-10-CM | POA: Diagnosis not present

## 2018-02-22 DIAGNOSIS — N959 Unspecified menopausal and perimenopausal disorder: Secondary | ICD-10-CM | POA: Diagnosis not present

## 2018-03-06 ENCOUNTER — Other Ambulatory Visit: Payer: Self-pay | Admitting: Women's Health

## 2018-03-06 DIAGNOSIS — Z1231 Encounter for screening mammogram for malignant neoplasm of breast: Secondary | ICD-10-CM

## 2018-03-13 ENCOUNTER — Other Ambulatory Visit: Payer: BLUE CROSS/BLUE SHIELD

## 2018-03-13 ENCOUNTER — Ambulatory Visit: Payer: BLUE CROSS/BLUE SHIELD | Admitting: Women's Health

## 2018-04-03 ENCOUNTER — Ambulatory Visit: Payer: BLUE CROSS/BLUE SHIELD | Admitting: Women's Health

## 2018-04-03 ENCOUNTER — Ambulatory Visit (INDEPENDENT_AMBULATORY_CARE_PROVIDER_SITE_OTHER): Payer: BLUE CROSS/BLUE SHIELD

## 2018-04-03 ENCOUNTER — Ambulatory Visit (INDEPENDENT_AMBULATORY_CARE_PROVIDER_SITE_OTHER): Payer: BLUE CROSS/BLUE SHIELD | Admitting: Women's Health

## 2018-04-03 ENCOUNTER — Other Ambulatory Visit: Payer: BLUE CROSS/BLUE SHIELD

## 2018-04-03 ENCOUNTER — Other Ambulatory Visit: Payer: Self-pay | Admitting: Women's Health

## 2018-04-03 ENCOUNTER — Encounter: Payer: Self-pay | Admitting: Women's Health

## 2018-04-03 VITALS — BP 102/80

## 2018-04-03 DIAGNOSIS — D252 Subserosal leiomyoma of uterus: Secondary | ICD-10-CM

## 2018-04-03 DIAGNOSIS — N950 Postmenopausal bleeding: Secondary | ICD-10-CM | POA: Diagnosis not present

## 2018-04-03 DIAGNOSIS — D251 Intramural leiomyoma of uterus: Secondary | ICD-10-CM | POA: Diagnosis not present

## 2018-04-03 DIAGNOSIS — N938 Other specified abnormal uterine and vaginal bleeding: Secondary | ICD-10-CM

## 2018-04-03 NOTE — Progress Notes (Signed)
58 year old MWF G5, P1 presents with complaint of postmenopausal spotting she is on Vivelle dot 0.0375 patch twice weekly and Prometrium 100 mg at bedtime.  Spotting noted mostly on toilet tissue, vaginal discharge with itching or odor, urinary symptoms, abdominal pain or fever.  2016 postmenopausal spotting negative sonohysterogram.  Medical problems include hypertension primary care manages.  Exam: Appears well.  Ultrasound: T/V uterus anteverted intramural and subserous fibroids 18 x 22 mm, 11 mm.  Endometrium within normal limits 1.3 mm.  Previous calcifications on wall of endometrium 5 mm no change.  Right and left ovary normal.  Negative cul-de-sac.  No apparent mass right or left adnexal.  Postmenopausal spotting on HRT  Plan: Will continue HRT, reassurance given regarding normality of ultrasound and thin endometrium.  Annual exam in September, will decrease .025 and gradually stop HRT.  Has had numerous hot flashes in the past.

## 2018-04-04 ENCOUNTER — Ambulatory Visit
Admission: RE | Admit: 2018-04-04 | Discharge: 2018-04-04 | Disposition: A | Discharge status: Home / Self Care | Payer: BLUE CROSS/BLUE SHIELD | Source: Ambulatory Visit | Attending: Women's Health | Admitting: Women's Health

## 2018-04-04 DIAGNOSIS — Z1231 Encounter for screening mammogram for malignant neoplasm of breast: Secondary | ICD-10-CM

## 2018-04-22 ENCOUNTER — Other Ambulatory Visit: Payer: BLUE CROSS/BLUE SHIELD

## 2018-04-22 ENCOUNTER — Ambulatory Visit: Payer: BLUE CROSS/BLUE SHIELD | Admitting: Women's Health

## 2018-06-25 DIAGNOSIS — D2262 Melanocytic nevi of left upper limb, including shoulder: Secondary | ICD-10-CM | POA: Diagnosis not present

## 2018-06-25 DIAGNOSIS — L820 Inflamed seborrheic keratosis: Secondary | ICD-10-CM | POA: Diagnosis not present

## 2018-06-25 DIAGNOSIS — D225 Melanocytic nevi of trunk: Secondary | ICD-10-CM | POA: Diagnosis not present

## 2018-06-25 DIAGNOSIS — D2272 Melanocytic nevi of left lower limb, including hip: Secondary | ICD-10-CM | POA: Diagnosis not present

## 2018-06-25 DIAGNOSIS — L821 Other seborrheic keratosis: Secondary | ICD-10-CM | POA: Diagnosis not present

## 2018-06-26 DIAGNOSIS — R739 Hyperglycemia, unspecified: Secondary | ICD-10-CM | POA: Diagnosis not present

## 2018-06-26 DIAGNOSIS — N959 Unspecified menopausal and perimenopausal disorder: Secondary | ICD-10-CM | POA: Diagnosis not present

## 2018-06-26 DIAGNOSIS — E039 Hypothyroidism, unspecified: Secondary | ICD-10-CM | POA: Diagnosis not present

## 2018-07-15 DIAGNOSIS — R7309 Other abnormal glucose: Secondary | ICD-10-CM | POA: Diagnosis not present

## 2018-07-15 DIAGNOSIS — N959 Unspecified menopausal and perimenopausal disorder: Secondary | ICD-10-CM | POA: Diagnosis not present

## 2018-07-15 DIAGNOSIS — E041 Nontoxic single thyroid nodule: Secondary | ICD-10-CM | POA: Diagnosis not present

## 2018-07-15 DIAGNOSIS — E039 Hypothyroidism, unspecified: Secondary | ICD-10-CM | POA: Diagnosis not present

## 2018-07-15 DIAGNOSIS — I10 Essential (primary) hypertension: Secondary | ICD-10-CM | POA: Diagnosis not present

## 2018-07-15 DIAGNOSIS — N950 Postmenopausal bleeding: Secondary | ICD-10-CM | POA: Diagnosis not present

## 2018-07-16 ENCOUNTER — Encounter: Payer: Self-pay | Admitting: Women's Health

## 2018-09-11 ENCOUNTER — Other Ambulatory Visit: Payer: Self-pay | Admitting: Cardiovascular Disease

## 2018-10-01 DIAGNOSIS — N959 Unspecified menopausal and perimenopausal disorder: Secondary | ICD-10-CM | POA: Diagnosis not present

## 2018-10-01 DIAGNOSIS — E039 Hypothyroidism, unspecified: Secondary | ICD-10-CM | POA: Diagnosis not present

## 2018-10-01 DIAGNOSIS — I10 Essential (primary) hypertension: Secondary | ICD-10-CM | POA: Diagnosis not present

## 2018-10-01 DIAGNOSIS — M8580 Other specified disorders of bone density and structure, unspecified site: Secondary | ICD-10-CM

## 2018-10-01 DIAGNOSIS — R739 Hyperglycemia, unspecified: Secondary | ICD-10-CM | POA: Diagnosis not present

## 2018-10-01 HISTORY — DX: Other specified disorders of bone density and structure, unspecified site: M85.80

## 2018-10-14 ENCOUNTER — Other Ambulatory Visit: Payer: Self-pay

## 2018-10-15 ENCOUNTER — Encounter: Payer: Self-pay | Admitting: Women's Health

## 2018-10-15 ENCOUNTER — Ambulatory Visit (INDEPENDENT_AMBULATORY_CARE_PROVIDER_SITE_OTHER): Payer: BC Managed Care – PPO | Admitting: Women's Health

## 2018-10-15 ENCOUNTER — Telehealth: Payer: Self-pay

## 2018-10-15 VITALS — BP 118/80 | Ht 66.0 in | Wt 139.0 lb

## 2018-10-15 DIAGNOSIS — Z01419 Encounter for gynecological examination (general) (routine) without abnormal findings: Secondary | ICD-10-CM | POA: Diagnosis not present

## 2018-10-15 DIAGNOSIS — Z13820 Encounter for screening for osteoporosis: Secondary | ICD-10-CM | POA: Diagnosis not present

## 2018-10-15 MED ORDER — ESTRADIOL 0.5 MG PO TABS: ORAL_TABLET | ORAL | 1 refills | Status: DC

## 2018-10-15 MED ORDER — ESTRADIOL 0.5 MG PO TABS: 0.5000 mg | ORAL_TABLET | Freq: Every day | ORAL | 1 refills | Status: DC

## 2018-10-15 NOTE — Progress Notes (Signed)
Brittany Graham BRIM May 11, 1960 [id number]    History:  Presents for annual exam. Postmenopausal on HRT with occasional brown spotting for past year. 07/15/18 PCP increased Estradiol patch from 0.0363m  to 0.564mand Prometrium from 10016mo 200 mg QD for excessive spotting/ bleeding. 03/2018 U/S endometrial lining 1.2mm26mNormal Pap history. Sister recently had bilateral mastectomy. 03/2018 3D mammogram normal with extremely dense tissue. Has not had screening DEXA. 2013 negative colonoscopy. Hypertension managed by cardiology and PCP.   Past medical history, past surgical history, family history and social history were all reviewed and documented in the EPIC chart. 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 Spouse recently lost job due to budg[employer]Corp[employer]udson Roch[place] well at [employer] [employer] [facility] [employer] in school have improved tremendously for her. Parents HTN, mother deceased from Alzheimer's. Sister age 24 y58. recent bilateral mastectomy, BRCA unknown, has 4 other sisters. History of GDM.   ROS:  A ROS was performed and pertinent positives and negatives are included.  Exam:  Vitals:   10/15/18 0930  BP: 118/80  Weight: 139 lb (63 kg)  Height: '5\' 6"'  (1.676 m)   Body mass index is 22.44 kg/m.   General appearance:  Normal Thyroid:  Symmetrical, normal in size, without palpable masses or nodularity. Respiratory  Auscultation:  Clear without wheezing or rhonchi Cardiovascular  Auscultation:  Regular rate, without rubs, murmurs or gallops  Edema/varicosities:  Not grossly evident Abdominal  Soft,nontender, without masses, guarding or rebound.  Liver/spleen:  No organomegaly noted  Hernia:  None appreciated  Skin  Inspection:  Grossly normal. Sees Dermatology.    Breasts: Examined lying and sitting.     Right: Without masses, retractions, discharge or axillary adenopathy. Fibrous tissue.      Left: Without masses, retractions, discharge or axillary  adenopathy. Fibrous tissue.  Gentitourinary   Inguinal/mons:  Normal without inguinal adenopathy  External genitalia:  Normal, without lesions, erythema or lacerations.   BUS/Urethra/Skene's glands:  Normal  Vagina:  Normal  Cervix:  Normal, parous. Scant dark blood noted on cervix. No CMT  Uterus:  Anteverted, normal in size, shape and contour.  Midline and mobile  Adnexa/parametria:     Rt: Without masses or tenderness.   Lt: Without masses or tenderness.  Anus and perineum: Normal  Digital rectal exam: Normal sphincter tone without palpated masses or tenderness  Assessment/Plan: 58 y24. MWF G5 P1 + adoptive daughter, for annual exam.  Postmenopausal on HRT with occasional spotting. Wants to discontinue HRT   Hypertension managed by Cardiology, labs & meds.   Plan: Oral Estradiol 0.5mg 71mlet ordered,  instructed to take 1/2 tablet QD with Prometrium 100mg 69mfor next 30 days then discontinue use per her request. Will schedule DEXA scan after checking insurance. SBE's, continue annual 3D screening mammogram, calcium rich foods, vitamin D 2000 daily encouraged.  Reviewed importance of continuing  exercise, balance type as well as weightbearing, home safety and fall prevention discussed. Copy of labs brought in and reviewed from 10/01/2018. Pap w/HR HPV, screening guideline reviewed.    Brittany Graham AM 10/15/2018

## 2018-10-15 NOTE — Addendum Note (Signed)
Addended by: Lorine Bears on: 10/15/2018 11:36 AM   Modules accepted: Orders

## 2018-10-15 NOTE — Telephone Encounter (Signed)
Clarify directions as take one tab daily po and take 1/2 tab daily po are on instructions. Instructions corrected per office note to read 1/2 tab daily and re-sent to pharmacy.

## 2018-10-15 NOTE — Patient Instructions (Signed)

## 2018-10-16 DIAGNOSIS — E041 Nontoxic single thyroid nodule: Secondary | ICD-10-CM | POA: Diagnosis not present

## 2018-10-16 DIAGNOSIS — R7309 Other abnormal glucose: Secondary | ICD-10-CM | POA: Diagnosis not present

## 2018-10-16 DIAGNOSIS — N950 Postmenopausal bleeding: Secondary | ICD-10-CM | POA: Diagnosis not present

## 2018-10-16 DIAGNOSIS — I10 Essential (primary) hypertension: Secondary | ICD-10-CM | POA: Diagnosis not present

## 2018-10-16 LAB — PAP, TP IMAGING W/ HPV RNA, RFLX HPV TYPE 16,18/45: HPV DNA High Risk: NOT DETECTED

## 2018-10-17 ENCOUNTER — Encounter: Payer: Self-pay | Admitting: Women's Health

## 2018-10-23 DIAGNOSIS — I10 Essential (primary) hypertension: Secondary | ICD-10-CM | POA: Diagnosis not present

## 2018-10-23 DIAGNOSIS — I6523 Occlusion and stenosis of bilateral carotid arteries: Secondary | ICD-10-CM | POA: Diagnosis not present

## 2018-10-24 DIAGNOSIS — I340 Nonrheumatic mitral (valve) insufficiency: Secondary | ICD-10-CM | POA: Diagnosis not present

## 2018-10-29 ENCOUNTER — Ambulatory Visit (INDEPENDENT_AMBULATORY_CARE_PROVIDER_SITE_OTHER): Payer: BC Managed Care – PPO

## 2018-10-29 ENCOUNTER — Encounter: Payer: Self-pay | Admitting: Gynecology

## 2018-10-29 ENCOUNTER — Other Ambulatory Visit: Payer: Self-pay | Admitting: Women's Health

## 2018-10-29 ENCOUNTER — Other Ambulatory Visit: Payer: Self-pay

## 2018-10-29 DIAGNOSIS — M8589 Other specified disorders of bone density and structure, multiple sites: Secondary | ICD-10-CM

## 2018-10-29 DIAGNOSIS — Z780 Asymptomatic menopausal state: Secondary | ICD-10-CM | POA: Diagnosis not present

## 2018-10-29 DIAGNOSIS — I10 Essential (primary) hypertension: Secondary | ICD-10-CM | POA: Diagnosis not present

## 2018-10-29 DIAGNOSIS — Z13820 Encounter for screening for osteoporosis: Secondary | ICD-10-CM

## 2018-10-29 DIAGNOSIS — I6523 Occlusion and stenosis of bilateral carotid arteries: Secondary | ICD-10-CM | POA: Diagnosis not present

## 2019-07-21 ENCOUNTER — Other Ambulatory Visit: Payer: Self-pay | Admitting: Physician Assistant

## 2019-07-21 DIAGNOSIS — Z1231 Encounter for screening mammogram for malignant neoplasm of breast: Secondary | ICD-10-CM

## 2019-07-23 ENCOUNTER — Ambulatory Visit
Admission: RE | Admit: 2019-07-23 | Discharge: 2019-07-23 | Disposition: A | Discharge status: Home / Self Care | Payer: BC Managed Care – PPO | Source: Ambulatory Visit

## 2019-07-23 ENCOUNTER — Other Ambulatory Visit: Payer: Self-pay

## 2019-07-23 DIAGNOSIS — Z1231 Encounter for screening mammogram for malignant neoplasm of breast: Secondary | ICD-10-CM

## 2019-11-06 ENCOUNTER — Other Ambulatory Visit: Payer: Self-pay

## 2019-11-06 ENCOUNTER — Ambulatory Visit (INDEPENDENT_AMBULATORY_CARE_PROVIDER_SITE_OTHER): Payer: Managed Care, Other (non HMO) | Admitting: Nurse Practitioner

## 2019-11-06 ENCOUNTER — Encounter: Payer: Self-pay | Admitting: Nurse Practitioner

## 2019-11-06 VITALS — BP 120/82 | Ht 65.5 in | Wt 136.4 lb

## 2019-11-06 DIAGNOSIS — Z79890 Hormone replacement therapy (postmenopausal): Secondary | ICD-10-CM | POA: Diagnosis not present

## 2019-11-06 DIAGNOSIS — Z01419 Encounter for gynecological examination (general) (routine) without abnormal findings: Secondary | ICD-10-CM | POA: Diagnosis not present

## 2019-11-06 DIAGNOSIS — M8589 Other specified disorders of bone density and structure, multiple sites: Secondary | ICD-10-CM

## 2019-11-06 NOTE — Progress Notes (Signed)
   Brittany Graham 1960-10-23 [id number]   History:  59 y.o. [id number] presents for annual exam. Postmenopausal on HRT. Stopped HRT about a year ago when husband lost insurance but restarted 1 month ago and would like refills. She takes for energy and says it just makes her feel better. Had occasional brown spotting a couple of years ago. Ultrasound 03/2018 showed thin endometrial lining at 1.2 mm. Normal pap and mammogram history. Sister diagnosed with breast cancer 1.5 years ago and had double mastectomy. Cardiology manages HTN. Would like labs today, she is not fasting.   Gynecologic History Contraception: post menopausal status Last Pap:10/15/2018. Results were: normal Last mammogram: 07/23/2019. Results were: normal  Last colonoscopy: 06/19/2011. Results were: normal, 10 year follow up recommended Last Dexa: 07/29/2018. Results were: t-score -1.2, FRAX 6.7% / 0.5%  Past medical history, past surgical history, family history and social history were all reviewed and documented in the EPIC chart.  ROS:  A ROS was performed and pertinent positives and negatives are included.  Exam:  Vitals:   11/06/19 0944  BP: 120/82  Weight: 136 lb 6.4 oz (61.9 kg)  Height: 5' 5.5" (1.664 m)   Body mass index is 22.35 kg/m.  General appearance:  Normal Thyroid:  Symmetrical, normal in size, without palpable masses or nodularity. Respiratory  Auscultation:  Clear without wheezing or rhonchi Cardiovascular  Auscultation:  Regular rate, without rubs, murmurs or gallops  Edema/varicosities:  Not grossly evident Abdominal  Soft,nontender, without masses, guarding or rebound.  Liver/spleen:  No organomegaly noted  Hernia:  None appreciated  Skin  Inspection:  Grossly normal   Breasts: Examined lying and sitting.   Right: Without masses, retractions, discharge or axillary adenopathy.   Left: Without masses, retractions, discharge or axillary adenopathy. Gentitourinary   Inguinal/mons:  Normal without  inguinal adenopathy  External genitalia:  Normal  BUS/Urethra/Skene's glands:  Normal  Vagina:  Normal  Cervix:  Normal  Uterus:  Normal in size, shape and contour.  Midline and mobile  Adnexa/parametria:     Rt: Without masses or tenderness.   Lt: Without masses or tenderness.  Anus and perineum: Normal  Digital rectal exam: Normal sphincter tone without palpated masses or tenderness  Assessment/Plan:  59 y.o. [id number] for annual exam.   Well woman exam with routine gynecological exam - Plan: CBC with Differential/Platelet, Comprehensive metabolic panel. Education provided on SBEs, importance of preventative screenings, current guidelines, high calcium diet, regular exercise, and multivitamin daily.   Hormone replacement therapy - Restarted 1 month ago after stopping about a year ago due to loss of insurance. We discussed her risks of continuing due to personal history of hypertension and sister's history of breast cancer. We both agree the risks outweigh the benefits at this point and she will stop HRT.   Osteopenia - 07/29/2018 t-score -1.2, FRAX 6.7% / 0.5%. Taking daily vitamin D supplement and exercises regularly. Will repeat next year.  Screening for cervical cancer - Normal pap history. Will repeat at 5 year interval.   Screening for breast cancer - Normal mammogram history and normal breast exam today.   Screening for colon cancer - 2013 with 10 year follow up recommended.   Follow up in 1 year for annual.       Tamela Gammon [place] [facility], 9:48 AM 11/06/2019

## 2019-11-06 NOTE — Patient Instructions (Signed)

## 2019-11-07 LAB — COMPREHENSIVE METABOLIC PANEL
AG Ratio: 1.8 (calc) (ref 1.0–2.5)
ALT: 12 U/L (ref 6–29)
AST: 23 U/L (ref 10–35)
Albumin: 4.4 g/dL (ref 3.6–5.1)
Alkaline phosphatase (APISO): 68 U/L (ref 37–153)
BUN: 15 mg/dL (ref 7–25)
CO2: 23 mmol/L (ref 20–32)
Calcium: 9.8 mg/dL (ref 8.6–10.4)
Chloride: 104 mmol/L (ref 98–110)
Creat: 0.87 mg/dL (ref 0.50–0.99)
Globulin: 2.4 g/dL (calc) (ref 1.9–3.7)
Glucose, Bld: 67 mg/dL (ref 65–99)
Potassium: 4.6 mmol/L (ref 3.5–5.3)
Sodium: 140 mmol/L (ref 135–146)
Total Bilirubin: 0.9 mg/dL (ref 0.2–1.2)
Total Protein: 6.8 g/dL (ref 6.1–8.1)

## 2019-11-07 LAB — CBC WITH DIFFERENTIAL/PLATELET
Absolute Monocytes: 369 cells/uL (ref 200–950)
Basophils Absolute: 28 cells/uL (ref 0–200)
Basophils Relative: 0.5 %
Eosinophils Absolute: 39 cells/uL (ref 15–500)
Eosinophils Relative: 0.7 %
HCT: 44.2 % (ref 35.0–45.0)
Hemoglobin: 14.4 g/dL (ref 11.7–15.5)
Lymphs Abs: 1590 cells/uL (ref 850–3900)
MCH: 29.4 pg (ref 27.0–33.0)
MCHC: 32.6 g/dL (ref 32.0–36.0)
MCV: 90.4 fL (ref 80.0–100.0)
MPV: 11.4 fL (ref 7.5–12.5)
Monocytes Relative: 6.7 %
Neutro Abs: 3476 cells/uL (ref 1500–7800)
Neutrophils Relative %: 63.2 %
Platelets: 266 10*3/uL (ref 140–400)
RBC: 4.89 10*6/uL (ref 3.80–5.10)
RDW: 12.6 % (ref 11.0–15.0)
Total Lymphocyte: 28.9 %
WBC: 5.5 10*3/uL (ref 3.8–10.8)

## 2020-01-06 DIAGNOSIS — R7309 Other abnormal glucose: Secondary | ICD-10-CM | POA: Diagnosis not present

## 2020-01-06 DIAGNOSIS — I10 Essential (primary) hypertension: Secondary | ICD-10-CM | POA: Diagnosis not present

## 2020-01-06 DIAGNOSIS — E039 Hypothyroidism, unspecified: Secondary | ICD-10-CM | POA: Diagnosis not present

## 2020-01-06 DIAGNOSIS — E041 Nontoxic single thyroid nodule: Secondary | ICD-10-CM | POA: Diagnosis not present

## 2020-02-10 DIAGNOSIS — I10 Essential (primary) hypertension: Secondary | ICD-10-CM | POA: Diagnosis not present

## 2020-02-10 DIAGNOSIS — I700 Atherosclerosis of aorta: Secondary | ICD-10-CM | POA: Diagnosis not present

## 2020-02-10 DIAGNOSIS — I6523 Occlusion and stenosis of bilateral carotid arteries: Secondary | ICD-10-CM | POA: Diagnosis not present

## 2020-05-04 DIAGNOSIS — E785 Hyperlipidemia, unspecified: Secondary | ICD-10-CM | POA: Diagnosis not present

## 2020-05-04 DIAGNOSIS — I6523 Occlusion and stenosis of bilateral carotid arteries: Secondary | ICD-10-CM | POA: Diagnosis not present

## 2020-05-04 DIAGNOSIS — I10 Essential (primary) hypertension: Secondary | ICD-10-CM | POA: Diagnosis not present

## 2020-05-20 DIAGNOSIS — L820 Inflamed seborrheic keratosis: Secondary | ICD-10-CM | POA: Diagnosis not present

## 2020-05-26 DIAGNOSIS — Z1211 Encounter for screening for malignant neoplasm of colon: Secondary | ICD-10-CM | POA: Diagnosis not present

## 2020-05-26 DIAGNOSIS — Z8371 Family history of colonic polyps: Secondary | ICD-10-CM | POA: Diagnosis not present

## 2020-07-23 ENCOUNTER — Other Ambulatory Visit: Payer: Self-pay | Admitting: Physician Assistant

## 2020-07-23 DIAGNOSIS — Z1231 Encounter for screening mammogram for malignant neoplasm of breast: Secondary | ICD-10-CM

## 2020-07-28 ENCOUNTER — Other Ambulatory Visit: Payer: Self-pay

## 2020-07-28 ENCOUNTER — Ambulatory Visit
Admission: RE | Admit: 2020-07-28 | Discharge: 2020-07-28 | Disposition: A | Discharge status: Home / Self Care | Payer: BC Managed Care – PPO | Source: Ambulatory Visit

## 2020-07-28 DIAGNOSIS — Z1231 Encounter for screening mammogram for malignant neoplasm of breast: Secondary | ICD-10-CM

## 2020-07-30 ENCOUNTER — Other Ambulatory Visit: Payer: Self-pay | Admitting: Physician Assistant

## 2020-07-30 DIAGNOSIS — R921 Mammographic calcification found on diagnostic imaging of breast: Secondary | ICD-10-CM

## 2020-08-09 ENCOUNTER — Other Ambulatory Visit: Payer: Self-pay | Admitting: Physician Assistant

## 2020-08-09 ENCOUNTER — Other Ambulatory Visit: Payer: Self-pay

## 2020-08-09 ENCOUNTER — Ambulatory Visit
Admission: RE | Admit: 2020-08-09 | Discharge: 2020-08-09 | Disposition: A | Discharge status: Home / Self Care | Payer: BC Managed Care – PPO | Source: Ambulatory Visit | Attending: Physician Assistant | Admitting: Physician Assistant

## 2020-08-09 DIAGNOSIS — R921 Mammographic calcification found on diagnostic imaging of breast: Secondary | ICD-10-CM

## 2020-08-09 DIAGNOSIS — R928 Other abnormal and inconclusive findings on diagnostic imaging of breast: Secondary | ICD-10-CM | POA: Diagnosis not present

## 2020-08-09 DIAGNOSIS — R922 Inconclusive mammogram: Secondary | ICD-10-CM | POA: Diagnosis not present

## 2020-08-17 DIAGNOSIS — I10 Essential (primary) hypertension: Secondary | ICD-10-CM | POA: Diagnosis not present

## 2020-11-09 ENCOUNTER — Ambulatory Visit (INDEPENDENT_AMBULATORY_CARE_PROVIDER_SITE_OTHER): Payer: BC Managed Care – PPO | Admitting: Nurse Practitioner

## 2020-11-09 ENCOUNTER — Encounter: Payer: Self-pay | Admitting: Nurse Practitioner

## 2020-11-09 ENCOUNTER — Other Ambulatory Visit: Payer: Self-pay

## 2020-11-09 VITALS — BP 150/94 | HR 66 | Resp 12 | Ht 64.5 in | Wt 133.0 lb

## 2020-11-09 DIAGNOSIS — Z780 Asymptomatic menopausal state: Secondary | ICD-10-CM | POA: Diagnosis not present

## 2020-11-09 DIAGNOSIS — M8589 Other specified disorders of bone density and structure, multiple sites: Secondary | ICD-10-CM

## 2020-11-09 DIAGNOSIS — I10 Essential (primary) hypertension: Secondary | ICD-10-CM | POA: Diagnosis not present

## 2020-11-09 DIAGNOSIS — Z01419 Encounter for gynecological examination (general) (routine) without abnormal findings: Secondary | ICD-10-CM | POA: Diagnosis not present

## 2020-11-09 NOTE — Progress Notes (Signed)
   Brittany Graham [birth year] [id number]   History:  60 y.o. [id number] presents for annual exam. Postmenopausal  - stopped HRT last year and doing well without it, occasional hot flashes. Normal pap and mammogram history. HTN managed by cardiology.   Gynecologic History Contraception: post menopausal status  Health maintenance Last Pap: 10/15/2018. Results were: normal, 5-year repeat Last mammogram: 07/28/2020. Results were: Probable benign right breast calcifications, 58-month follow up recommended (scheduled 02/09/2021) Last colonoscopy: 2022. Results were: Normal, 10-year recall Last Dexa: 10/29/2018. Results were: t-score -1.2, FRAX 6.7% / 0.5%  Past medical history, past surgical history, family history and social history were all reviewed and documented in the EPIC chart. Married. Daughter working on Oceanographer at [employer] for business, has Merchant navy officer shop. Youngest daughter in HS. Sister with history of breast cancer.   ROS:  A ROS was performed and pertinent positives and negatives are included.  Exam:  Vitals:   11/09/20 0826  BP: (!) 150/94  Pulse: 66  Resp: 12  Weight: 133 lb (60.3 kg)  Height: 5' 4.5" (1.638 m)    Body mass index is 22.48 kg/m.  General appearance:  Normal Thyroid:  Symmetrical, normal in size, without palpable masses or nodularity. Respiratory  Auscultation:  Clear without wheezing or rhonchi Cardiovascular  Auscultation:  Regular rate, without rubs, murmurs or gallops  Edema/varicosities:  Not grossly evident Abdominal  Soft,nontender, without masses, guarding or rebound.  Liver/spleen:  No organomegaly noted  Hernia:  None appreciated  Skin  Inspection:  Grossly normal   Breasts: Examined lying and sitting.   Right: Without masses, retractions, discharge or axillary adenopathy.   Left: Without masses, retractions, discharge or axillary adenopathy. Gentitourinary   Inguinal/mons:  Normal without inguinal adenopathy  External genitalia:   Normal  BUS/Urethra/Skene's glands:  Normal  Vagina:  Atrophic changes  Cervix:  Normal  Uterus:  Normal in size, shape and contour.  Midline and mobile  Adnexa/parametria:     Rt: Without masses or tenderness.   Lt: Without masses or tenderness.  Anus and perineum: Normal  Digital rectal exam: Normal sphincter tone without palpated masses or tenderness  Assessment/Plan:  60 y.o. [id number] for annual exam.   Well female exam with routine gynecological exam - Education provided on SBEs, importance of preventative screenings, current guidelines, high calcium diet, regular exercise, and multivitamin daily.  Labs with PCP.  Osteopenia of multiple sites - Plan: DG Bone Density.  07/29/2018 t-score -1.2 without elevated FRAX. Taking daily vitamin D supplement and exercises regularly. Will schedule Dexa now.   Postmenopausal - Plan: DG Bone Density. Stopped HRT last year and doing fine without it. Occasional hot flashes.   Hypertension, unspecified type - BP 150/94, recheck same. Cardiology manages. She checks at home. Recommend following up with cardiology.   Screening for cervical cancer - Normal Pap history.  Will repeat at 5-year interval per guidelines.  Screening for breast cancer - 06/2020 - Probable benign right breast calcifications, 79-month follow up recommended (scheduled 02/09/2021). Normal breast exam today.  Screening for colon cancer - 2022 colonoscopy. Will repeat at GI's recommended interval.   Follow up in 1 year for annual.       Tamela Gammon [facility], 8:54 AM 11/09/2020

## 2020-12-29 DIAGNOSIS — R319 Hematuria, unspecified: Secondary | ICD-10-CM | POA: Diagnosis not present

## 2020-12-29 DIAGNOSIS — I10 Essential (primary) hypertension: Secondary | ICD-10-CM | POA: Diagnosis not present

## 2020-12-29 DIAGNOSIS — N390 Urinary tract infection, site not specified: Secondary | ICD-10-CM | POA: Diagnosis not present

## 2020-12-29 DIAGNOSIS — R350 Frequency of micturition: Secondary | ICD-10-CM | POA: Diagnosis not present

## 2021-02-08 DIAGNOSIS — D2271 Melanocytic nevi of right lower limb, including hip: Secondary | ICD-10-CM | POA: Diagnosis not present

## 2021-02-08 DIAGNOSIS — L821 Other seborrheic keratosis: Secondary | ICD-10-CM | POA: Diagnosis not present

## 2021-02-08 DIAGNOSIS — D2272 Melanocytic nevi of left lower limb, including hip: Secondary | ICD-10-CM | POA: Diagnosis not present

## 2021-02-08 DIAGNOSIS — L570 Actinic keratosis: Secondary | ICD-10-CM | POA: Diagnosis not present

## 2021-02-08 DIAGNOSIS — D225 Melanocytic nevi of trunk: Secondary | ICD-10-CM | POA: Diagnosis not present

## 2021-02-09 ENCOUNTER — Ambulatory Visit
Admission: RE | Admit: 2021-02-09 | Discharge: 2021-02-09 | Disposition: A | Discharge status: Home / Self Care | Payer: BC Managed Care – PPO | Source: Ambulatory Visit | Attending: Physician Assistant | Admitting: Physician Assistant

## 2021-02-09 ENCOUNTER — Other Ambulatory Visit: Payer: Self-pay | Admitting: Physician Assistant

## 2021-02-09 DIAGNOSIS — R921 Mammographic calcification found on diagnostic imaging of breast: Secondary | ICD-10-CM

## 2021-02-09 DIAGNOSIS — R922 Inconclusive mammogram: Secondary | ICD-10-CM | POA: Diagnosis not present

## 2021-02-17 DIAGNOSIS — I10 Essential (primary) hypertension: Secondary | ICD-10-CM | POA: Diagnosis not present

## 2021-02-17 DIAGNOSIS — I6523 Occlusion and stenosis of bilateral carotid arteries: Secondary | ICD-10-CM | POA: Diagnosis not present

## 2021-02-21 DIAGNOSIS — R0789 Other chest pain: Secondary | ICD-10-CM | POA: Diagnosis not present

## 2021-02-21 DIAGNOSIS — E039 Hypothyroidism, unspecified: Secondary | ICD-10-CM | POA: Diagnosis not present

## 2021-02-21 DIAGNOSIS — I10 Essential (primary) hypertension: Secondary | ICD-10-CM | POA: Diagnosis not present

## 2021-02-22 DIAGNOSIS — I340 Nonrheumatic mitral (valve) insufficiency: Secondary | ICD-10-CM | POA: Diagnosis not present

## 2021-02-22 DIAGNOSIS — I517 Cardiomegaly: Secondary | ICD-10-CM | POA: Diagnosis not present

## 2021-06-09 DIAGNOSIS — I10 Essential (primary) hypertension: Secondary | ICD-10-CM | POA: Diagnosis not present

## 2021-06-09 DIAGNOSIS — I6523 Occlusion and stenosis of bilateral carotid arteries: Secondary | ICD-10-CM | POA: Diagnosis not present

## 2021-06-14 DIAGNOSIS — D225 Melanocytic nevi of trunk: Secondary | ICD-10-CM | POA: Diagnosis not present

## 2021-06-14 DIAGNOSIS — L570 Actinic keratosis: Secondary | ICD-10-CM | POA: Diagnosis not present

## 2021-06-14 DIAGNOSIS — D485 Neoplasm of uncertain behavior of skin: Secondary | ICD-10-CM | POA: Diagnosis not present

## 2021-07-14 ENCOUNTER — Ambulatory Visit (INDEPENDENT_AMBULATORY_CARE_PROVIDER_SITE_OTHER): Payer: BC Managed Care – PPO | Admitting: Cardiovascular Disease

## 2021-07-14 ENCOUNTER — Encounter (HOSPITAL_BASED_OUTPATIENT_CLINIC_OR_DEPARTMENT_OTHER): Payer: Self-pay | Admitting: Cardiovascular Disease

## 2021-07-14 VITALS — BP 164/88 | HR 72 | Ht 66.0 in | Wt 130.5 lb

## 2021-07-14 DIAGNOSIS — E785 Hyperlipidemia, unspecified: Secondary | ICD-10-CM | POA: Diagnosis not present

## 2021-07-14 DIAGNOSIS — I6523 Occlusion and stenosis of bilateral carotid arteries: Secondary | ICD-10-CM

## 2021-07-14 DIAGNOSIS — I10 Essential (primary) hypertension: Secondary | ICD-10-CM

## 2021-07-14 MED ORDER — AMLODIPINE BESYLATE 5 MG PO TABS: 5.0000 mg | ORAL_TABLET | Freq: Every day | ORAL | 11 refills | Status: AC

## 2021-07-14 NOTE — Progress Notes (Signed)
Cardiology Office Note   Date:  07/14/2021   ID:  Brittany Graham, Brittany Graham 27-Apr-1960, MRN [id number]  PCP:  Aura Dials, PA-C  Cardiologist:   Skeet Latch, MD   No chief complaint on file.    History of Present Illness: Brittany Graham is a 61 y.o. female with hypertension, mild carotid stenosis, and family history of premature CAD here for re-established care. Was last seen for follow-up back in 2018.  Brittany Graham reports first being diagnosed with hypertension in 2012.  She also had preeclampsia when pregnant.  He has previously taken Bystolic and HCTZ/lisinopril and remembers feeling poorly on Bystolic.  It was felt that her blood pressure was elevated due to a perimenopausal state and she subsequently stopped taking any medication.  Her blood pressure was okay for 2-3 years but then started to increase.  She was started on HCTZ/lisinopril but was only taking half the prescribed dose. This was subsequently reduced to lisinopril 10 mg daily. She followed up with our pharmacist 10/2015 and her BP was well-controlled.  Brittany Graham sees an integrative medicine physician in [place]. She recommends that patient avoid having a mammogram every year, but rather alternate them with a thermogram every other year.  Brittany Graham had her thermogram, which showed some inflammation in her right carotid artery. She was then referred for carotid Dopplers 03/2015 that revealed mild bilateral carotid stenosis. She was started on aspirin 81 mg daily.   She saw her OBG/YN on 10/2020, and her blood pressure was 150/94. She was previously seeing a cardiologist at [facility], and her lisinopril was switched to losartan. Her blood pressure was not controlled and she felt poorly on losartan, so she switched back to lisinopril. She reports that she felt depressed and more irritable when taking the losartan, so she started taking lisinopril again. Her cough has recurred.  She does not take HCTZ because it gives her the  jitters. At home, her recent  blood pressures have been 137/84 and 129/78. For exercise, she goes up and down the stairs and walks a lot when at work. For diet, she admits that she is a picky eater, but avoids sodium intake. After walking for a while at [facility], she sat down and propped her feet up and she saw that her legs were swollen. She recently found out that she snores, and drinks two cups of coffee in the mornings. For pain relief she takes Advil, but only about once a month for headaches. She remains compliant with her other medications. The patient denies chest pain, shortness of breath, nocturnal dyspnea, orthopnea.  There have been no palpitations, lightheadedness or syncope.   Prior antihypertensives:  Lisinopril-cough Losartan-depression HCTZ-jittery Nebivolol-jittery   Past Medical History:  Diagnosis Date   Carotid stenosis 10/25/2015   Hematuria, microscopic    HISTORY OF   Hypertension 2008   ALSO,HX OF PREG INDUCED HYPERTENSION   Osteopenia 10/2018   T score -1.5 FRAX 6.7% / 0.5%   Spontaneous abortion    X 3    Past Surgical History:  Procedure Laterality Date   DILATION AND CURETTAGE OF UTERUS       Current Outpatient Medications  Medication Sig Dispense Refill   amLODipine (NORVASC) 5 MG tablet Take 1 tablet (5 mg total) by mouth daily. 30 tablet 11   aspirin EC 81 MG tablet Take 1 tablet (81 mg total) by mouth daily. 90 tablet 3   Cholecalciferol 5000 units TABS Take by mouth once.  fish oil-omega-3 fatty acids 1000 MG capsule Take 2 g by mouth daily.       MAGNESIUM CITRATE PO Take 100 mg by mouth 2 (two) times daily.     Multiple Vitamin (MULTIVITAMIN PO) Take by mouth.       NP THYROID 60 MG tablet Take 60 mg by mouth every morning.     No current facility-administered medications for this visit.    Allergies:   Codeine, Losartan, and Pindolol    Social History:  The patient  reports that she has never smoked. She has never used smokeless  tobacco. She reports current alcohol use. She reports that she does not use drugs.   Family History:  The patient's family history includes CAD in her father; CAD (age of onset: 14) in her sister; CAD (age of onset: 22) in her sister; Cancer (age of onset: 31) in her maternal grandmother; Colon polyps in her sister and sister; Dementia in her mother; Heart attack in her sister and sister; Heart disease in her father; Hypertension in her father and mother.    ROS:   Please see the history of present illness. (+) LE Edema All other systems are reviewed and negative.     PHYSICAL EXAM: VS:  BP (!) 164/88 (BP Location: Right Arm, Patient Position: Sitting, Cuff Size: Normal)   Pulse 72   Ht '5\' 6"'$  (1.676 m)   Wt 130 lb 8 oz (59.2 kg)   LMP 09/12/2017   BMI 21.06 kg/m  , BMI Body mass index is 21.06 kg/m. GENERAL:  Well appearing HEENT: Pupils equal round and reactive, fundi not visualized, oral mucosa unremarkable NECK:  No jugular venous distention, waveform within normal limits, carotid upstroke brisk and symmetric, no bruits, no thyromegaly LUNGS:  Clear to auscultation bilaterally HEART:  RRR.  PMI not displaced or sustained,S1 and S2 within normal limits, no S3, no S4, no clicks, no rubs, no murmurs ABD:  Flat, positive bowel sounds normal in frequency in pitch, no bruits, no rebound, no guarding, no midline pulsatile mass, no hepatomegaly, no splenomegaly EXT:  2 plus pulses throughout, no edema, no cyanosis no clubbing SKIN:  No rashes no nodules NEURO:  Cranial nerves II through XII grossly intact, motor grossly intact throughout PSYCH:  Cognitively intact, oriented to person place and time  EKG: EKG is personally reviewed.  07/14/2021 EKG: Rate 72. Sinus Rhythm. The ekg ordered 10/25/15 demonstrates sinus rhythm rate 70 bpm.   10/18/16: Sinus rhythm. Rate 71 bpm.    Carotid Dopplers 04/02/15:1-39% ICA stenosis bilaterally.  Recent Labs: No results found for requested labs within  last 365 days.   Echo 01/2021, with LVEF 50-55%, and normal diastolic function.   Echo (at [facility]) 02/22/2021: Left Ventricle: Systolic function is low normal. EF: 50-55%.  Quantitative analysis of left ventricular Global Longitudinal Strain (GLS)  imaging is -16.300%.    Left Ventricle: Left ventricle size is normal.    Left Ventricle: No regional wall motion abnormalities noted.    Left Ventricle: Doppler parameters indicate normal diastolic function.    Left Atrium: Left atrium size is normal. Left atrium volume index is  normal (16-34 mL/m2).   VAS Carotid Duplex Bilateral 11/08/2016: Final Interpretation:  Right Carotid: Essentially stable 1-39% RICA stenosis. The extracranial vessels were near-normal with only minimal wall thickening or plaque.  RICA velocities remain within normal range and have  decreased compared to the prior exam.   Left Carotid: Essentially stable 7-82% LICA stenosis. The extracranial vessels  were near-normal with only minimal wall thickening or  plaque. LICA velocities remain within normal range and have decreased compared to the prior exam.   Vertebrals:  Both vertebral arteries were patent with antegrade flow.  Subclavians: Normal flow hemodynamics were seen in bilateral subclavian arteries.    Lipid Panel    Component Value Date/Time   CHOL 177 03/30/2015 0836   TRIG 63 03/30/2015 0836   HDL 65 03/30/2015 0836   CHOLHDL 2.7 03/30/2015 0836   VLDL 13 03/30/2015 0836   LDLCALC 99 03/30/2015 0836      Wt Readings from Last 3 Encounters:  07/14/21 130 lb 8 oz (59.2 kg)  11/09/20 133 lb (60.3 kg)  11/06/19 136 lb 6.4 oz (61.9 kg)      ASSESSMENT AND PLAN:  HYPERTENSION, BENIGN Blood pressure is poorly controlled.  She hasn't tolerated lisinopril or losartan.  HCTZ and nebivolol were not tolerated well.  We we will stop the lisinopril and add amlodipine 5 mg daily.  We did discuss swelling is the most likely side effect.  She will check  her blood pressures at home and bring her log to follow-up in 1 month.  Carotid stenosis Mild bilateral carotid stenosis.  Checking coronary calcium score.  Continue aspirin.  Would consider starting a statin and reducing her LDL goal to less than 70.  Her carotid stenosis has been mild for years.   Current medicines are reviewed at length with the patient today.  The patient does not have concerns regarding medicines.  The following changes have been made: stop lisinopril and start amlodipine Labs/ tests ordered today include:   Orders Placed This Encounter  Procedures   CT CARDIAC SCORING (SELF PAY ONLY)   EKG 12-Lead    Disposition: FU with Merlinda Wrubel C. Oval Linsey, MD, [facility] in 1 year.  1 month with Laurann Montana, NP   This note was written with the assistance of speech recognition software.  Please excuse any transcriptional errors.   I,Tinashe Williams,acting as a Education administrator for [place], MD.,have documented all relevant documentation on the behalf of Skeet Latch, MD,as directed by  Skeet Latch, MD while in the presence of Skeet Latch, MD.   I, Rogers Oval Linsey, MD have reviewed all documentation for this visit.  The documentation of the exam, diagnosis, procedures, and orders on 07/14/2021 are all accurate and complete.

## 2021-07-14 NOTE — Patient Instructions (Addendum)
Medication Instructions:  STOP- Lisinopril START- Amlodipine 5 mg by mouth daily  *If you need a refill on your cardiac medications before your next appointment, please call your pharmacy*   Lab Work: None Ordered   Testing/Procedures: Your physician has requested that you have a Coronary Calcium Score Test. For further information please visit [email or web address]. Please follow instruction sheet, as given.    Follow-Up: At [facility], you and your health needs are our priority.  As part of our continuing mission to provide you with exceptional heart care, we have created designated Provider Care Teams.  These Care Teams include your primary Cardiologist (physician) and Advanced Practice Providers (APPs -  Physician Assistants and Nurse Practitioners) who all work together to provide you with the care you need, when you need it.  We recommend signing up for the patient portal called "MyChart".  Sign up information is provided on this After Visit Summary.  MyChart is used to connect with patients for Virtual Visits (Telemedicine).  Patients are able to view lab/test results, encounter notes, upcoming appointments, etc.  Non-urgent messages can be sent to your provider as well.   To learn more about what you can do with MyChart, go to [email or web address].    Your next appointment:   1 year(s)  The format for your next appointment:   In Person  Provider:   Skeet Latch, MD    Other Instructions 1 Month with Erasmo Downer or Laurann Montana  Important Information About Sugar

## 2021-07-14 NOTE — Assessment & Plan Note (Signed)
Mild bilateral carotid stenosis.  Checking coronary calcium score.  Continue aspirin.  Would consider starting a statin and reducing her LDL goal to less than 70.  Her carotid stenosis has been mild for years.

## 2021-07-14 NOTE — Assessment & Plan Note (Signed)
Blood pressure is poorly controlled.  She hasn't tolerated lisinopril or losartan.  HCTZ and nebivolol were not tolerated well.  We we will stop the lisinopril and add amlodipine 5 mg daily.  We did discuss swelling is the most likely side effect.  She will check her blood pressures at home and bring her log to follow-up in 1 month.

## 2021-07-22 ENCOUNTER — Ambulatory Visit (HOSPITAL_BASED_OUTPATIENT_CLINIC_OR_DEPARTMENT_OTHER)
Admission: RE | Admit: 2021-07-22 | Discharge: 2021-07-22 | Disposition: A | Discharge status: Home / Self Care | Payer: BC Managed Care – PPO | Source: Ambulatory Visit | Attending: Cardiovascular Disease | Admitting: Cardiovascular Disease

## 2021-07-22 DIAGNOSIS — E785 Hyperlipidemia, unspecified: Secondary | ICD-10-CM | POA: Insufficient documentation

## 2021-08-05 ENCOUNTER — Telehealth: Payer: Self-pay | Admitting: Cardiovascular Disease

## 2021-08-05 DIAGNOSIS — Z5181 Encounter for therapeutic drug level monitoring: Secondary | ICD-10-CM

## 2021-08-05 DIAGNOSIS — E785 Hyperlipidemia, unspecified: Secondary | ICD-10-CM

## 2021-08-05 MED ORDER — ROSUVASTATIN CALCIUM 10 MG PO TABS: 10.0000 mg | ORAL_TABLET | Freq: Every day | ORAL | 3 refills | Status: DC

## 2021-08-05 NOTE — Telephone Encounter (Signed)
?  Pt is returning call to get result ?

## 2021-08-05 NOTE — Telephone Encounter (Signed)
Advised patient of lab results and mailed lab orders

## 2021-08-05 NOTE — Telephone Encounter (Signed)
-----   Message from Skeet Latch, MD sent at 08/05/2021 12:39 PM EDT ----- Calcium score is 85th percentile for age and gender.  Recommend starting rosuvastatin 10 mg to lower her LDL to less than 70.  Repeat lipids and a CMP in 2 to 3 months.

## 2021-08-11 ENCOUNTER — Ambulatory Visit
Admission: RE | Admit: 2021-08-11 | Discharge: 2021-08-11 | Disposition: A | Discharge status: Home / Self Care | Payer: BC Managed Care – PPO | Source: Ambulatory Visit | Attending: Physician Assistant | Admitting: Physician Assistant

## 2021-08-11 DIAGNOSIS — R921 Mammographic calcification found on diagnostic imaging of breast: Secondary | ICD-10-CM | POA: Diagnosis not present

## 2021-08-15 ENCOUNTER — Ambulatory Visit (HOSPITAL_BASED_OUTPATIENT_CLINIC_OR_DEPARTMENT_OTHER): Payer: BC Managed Care – PPO | Admitting: Family

## 2021-08-25 NOTE — Progress Notes (Signed)
Cardiology Office Note:    Date:  08/26/2021   ID:  Brittany Graham, DOB 1960/12/15, MRN [id number]  PCP:  Spencer, Sara C, [facility] Prov[facility] Cardiologist:  None     Referring MD: Aura Dials, PA-C    CC: Fatigue and lower extremity swelling  History of Present Illness:    Brittany Graham is a 61 y.o. female with a hx of the following:    HTN Mild Carotid stenosis Fatigue  Dizziness Hematuria, unspecified Family hx of premature CAD  She re-established cardiac care with Dr. Skeet Latch on July 14, 2021. HTN diagnosed in 2012, also had preeclampsia when she was pregnant.  Stated had taken several prior antihypertensives, however she was not able to tolerate these, including lisinopril - cough, losartan - depression, hydrochlorothiazide - jittery, and nebivolol - jittery.  Also stated she was referred for carotid Dopplers in March 2017 that revealed mild bilateral carotid stenosis and was started on 81 mg of aspirin daily. BP on exam that day was 164/88, and Dr. Oval Linsey stopped her lisinopril and added amlodipine 5 mg daily.  She was told to check her BP at home and bring her BP log to follow-up in 1 month.  Coronary calcium score check for carotid stenosis history, with consideration at that time of starting a statin in the future to reduce her LDL goal to be less than 70.  Coronary calcium score on July 22, 2021 was 93.  This was 85th percentile for age, race, and sex-matched controls.  Crestor 10 mg daily was initiated based on this score.  Today she presents for 1 month follow-up.  She states she has been doing well from a cardiac perspective.  Only CC today is fatigue since starting amlodipine as well as some mild lower extremity swelling after she is walking.  Thinks this may be related to the shoes that she is wearing when she walks.  Blood pressure log shows well-controlled blood pressures with average readings in the 585'I to 778E systolic.  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  Denies chest pain, shortness of breath, palpitations, syncope, presyncope, dizziness, bleeding, orthopnea, PND, weight changes, or claudication.  Denies any myalgias since starting Crestor.  Reports taking 1 capsule of Vitamin K daily and will update medication records to reflect this. Denies any other questions or concerns today.   Past Medical History:  Diagnosis Date   Carotid stenosis 10/25/2015   Hematuria, microscopic    HISTORY OF   Hypertension 2008   ALSO,HX OF PREG INDUCED HYPERTENSION   Osteopenia 10/2018   T score -1.5 FRAX 6.7% / 0.5%   Spontaneous abortion    X 3    Past Surgical History:  Procedure Laterality Date   DILATION AND CURETTAGE OF UTERUS      Current Medications: Current Meds  Medication Sig   amLODipine (NORVASC) 5 MG tablet Take 1 tablet (5 mg total) by mouth daily.   aspirin EC 81 MG tablet Take 1 tablet (81 mg total) by mouth daily.   Cholecalciferol 5000 units TABS Take by mouth once.   fish oil-omega-3 fatty acids 1000 MG capsule Take 1 g by mouth daily.   MAGNESIUM CITRATE PO Take 100 mg by mouth 2 (two) times daily.   Multiple Vitamin (MULTIVITAMIN PO) Take by mouth.     NP THYROID 60 MG tablet Take 60 mg by mouth every morning.   rosuvastatin (CRESTOR) 10 MG tablet  Take 1 tablet (10 mg total) by mouth daily.   vitamin k 100 MCG tablet Take 100 mcg by mouth daily. She reports taking 1 capsule daily.     Allergies:   Codeine, Losartan, and Pindolol   Social History   Socioeconomic History   Marital status: Married    Spouse name: Not on file   Number of children: Not on file   Years of education: Not on file   Highest education level: Not on file  Occupational History   Not on file  Tobacco Use   Smoking status: Never   Smokeless tobacco: Never  Vaping Use   Vaping Use: Never used  Substance and Sexual Activity   Alcohol use: Yes    Alcohol/week: 0.0 standard drinks of  alcohol    Comment: occ. wine   Drug use: No   Sexual activity: Yes    Partners: Male    Birth control/protection: None    Comment: INTRECOURSE AGE 89, SEXUAL PARTNERS LESS THAN 5  Other Topics Concern   Not on file  Social History Narrative   Epworth Sleepiness Scale Score:  8      --I have HTN   --I have problems with memory/concentration   Social Determinants of Health   Financial Resource Strain: Low Risk  (07/14/2021)   Overall Financial Resource Strain (CARDIA)    Difficulty of Paying Living Expenses: Not hard at all  Food Insecurity: No Food Insecurity (07/14/2021)   Hunger Vital Sign    Worried About Running Out of Food in the Last Year: Never true    Ran Out of Food in the Last Year: Never true  Transportation Needs: No Transportation Needs (07/14/2021)   PRAPARE - Hydrologist (Medical): No    Lack of Transportation (Non-Medical): No  Physical Activity: Sufficiently Active (07/14/2021)   Exercise Vital Sign    Days of Exercise per Week: 3 days    Minutes of Exercise per Session: 50 min  Stress: Not on file  Social Connections: Not on file     Family History: The patient's family history includes CAD in her father; CAD (age of onset: 102) in her sister; CAD (age of onset: 29) in her sister; Cancer (age of onset: 108) in her maternal grandmother; Colon polyps in her sister and sister; Dementia in her mother; Heart attack in her sister and sister; Heart disease in her father; Hypertension in her father and mother.  ROS:   Review of Systems  Constitutional:  Positive for malaise/fatigue. Negative for chills, diaphoresis, fever and weight loss.  HENT: Negative.    Eyes: Negative.   Cardiovascular:  Positive for leg swelling. Negative for chest pain, palpitations, orthopnea, claudication and PND.  Gastrointestinal: Negative.   Genitourinary: Negative.   Musculoskeletal: Negative.   Skin: Negative.   Neurological: Negative.    Endo/Heme/Allergies: Negative.   Psychiatric/Behavioral: Negative.     Please see the history of present illness.    All other systems reviewed and are negative.  EKGs/Labs/Other Studies Reviewed:    The following studies were reviewed today:   EKG:  EKG is not ordered today.    Coronary calcium score on July 22, 2021: Coronary calcium score of 93.  This was 85th percentile for age, race, and sex-matched controls. Recommendations: If CAC is 1-99, it is reasonable to initiate statin therapy for patients greater than or equal to 48 years of age.    Bilateral carotid duplex on November 08, 2016: Mild carotid  stenosis bilaterally, unchanged from the year prior.   Bilateral Carotid duplex on April 02, 2015: Mild plaque noted in the left and right carotid arteries.  Recommended that she take 81 mg of aspirin daily.  Recent Labs: No results found for requested labs within last 365 days.  Recent Lipid Panel    Component Value Date/Time   CHOL 177 03/30/2015 0836   TRIG 63 03/30/2015 0836   HDL 65 03/30/2015 0836   CHOLHDL 2.7 03/30/2015 0836   VLDL 13 03/30/2015 0836   LDLCALC 99 03/30/2015 0836    Physical Exam:    VS:  BP 129/75 (BP Location: Left Arm, Patient Position: Sitting, Cuff Size: Normal)   Pulse 71   Ht '5\' 6"'$  (1.676 m)   Wt 131 lb 3.2 oz (59.5 kg)   LMP 09/12/2017   SpO2 99%   BMI 21.18 kg/m     Wt Readings from Last 3 Encounters:  08/26/21 131 lb 3.2 oz (59.5 kg)  07/14/21 130 lb 8 oz (59.2 kg)  11/09/20 133 lb (60.3 kg)     GEN: Well nourished, well developed in no acute distress HEENT: Normal NECK: No JVD; No carotid bruits CARDIAC: RRR, no murmurs, rubs, gallops; 2+ pulses along radials and PT's, strong and equal bilaterally RESPIRATORY:  Clear to auscultation without rales, wheezing or rhonchi  ABDOMEN: Soft, non-tender, non-distended MUSCULOSKELETAL:  Minimal, nonpitting edema along BLE; No deformity  SKIN: Warm and dry NEUROLOGIC:  Alert and  oriented x 3 PSYCHIATRIC:  Normal affect   ASSESSMENT:    1. Other fatigue   2. HYPERTENSION, BENIGN   3. Coronary artery calcification   4. Bilateral carotid artery stenosis    PLAN:    In order of problems listed above:  Fatigue, bilateral lower extremity edema - acute, stable Said this has occurred after recently starting amlodipine, with minimal BLE edema that she believes is r/t her shoes.  Denies any lightheadedness, syncope or presyncope.  Will draw CBC, TSH, Mag, with her other fasting blood work in September to rule out any anemia, hypothyroidism, or another cause of fatigue. Gave her info about [employer] to reduce BLE edema. Will continue to monitor.   2. Hypertension - chronic, improved BP well controlled and stable at home. Discussed to monitor BP at home at least 2 hours after medications and sitting for 5-10 minutes. Continue to log BP and told her if BP < 100/80 and she develops symptoms, to let us know. Continue Amlodipine 5 mg PO daily.   3. Coronary artery calcification - chronic, stable Coronary calcium score of 93 in June 2023.  Has been started on Crestor and aspirin 81 mg daily per recommendation.  Tolerating this well and will continue this medication regimen.  Last lipid profile via [facility] from August 25, 2020 was 51.  Will draw fasting lipid panel [facility] in 2 months along with other labwork mentioned above.    4. Bilateral carotid artery stenosis - chronic, stable Mild plaque noted on right and left carotid arteries initially via bilateral carotid duplex in 2017.  No carotid bruit heard during auscultation.  Continue current medication regimen.  5. Disposition: F/U in 4-6 months or sooner if needed.    Medication Adjustments/Labs and Tests Ordered: Current medicines are reviewed at length with the patient today.  Concerns regarding medicines are outlined above.  Orders Placed This Encounter  Procedures   Comprehensive metabolic panel   CBC    Magnesium   Thyroid Panel With TSH  Lipid panel   No orders of the defined types were placed in this encounter.   Patient Instructions  Medication Instructions:  Continue your current medications.   *If you need a refill on your cardiac medications before your next appointment, please call your pharmacy*   Lab Work: Your physician recommends that you return for lab work in 2 months for CBC, TSH, lipid panel, CMP, magnesium  If you have labs (blood work) drawn today and your tests are completely normal, you will receive your results only by: [place] (if you have MyChart) OR A paper copy in the mail If you have any lab test that is abnormal or we need to change your treatment, we will call you to review the results.   Testing/Procedures: None ordered today.   Follow-Up: At [facility], you and your health needs are our priority.  As part of our continuing mission to provide you with exceptional heart care, we have created designated Provider Care Teams.  These Care Teams include your primary Cardiologist (physician) and Advanced Practice Providers (APPs -  Physician Assistants and Nurse Practitioners) who all work together to provide you with the care you need, when you need it.  We recommend signing up for the patient portal called "MyChart".  Sign up information is provided on this After Visit Summary.  MyChart is used to connect with patients for Virtual Visits (Telemedicine).  Patients are able to view lab/test results, encounter notes, upcoming appointments, etc.  Non-urgent messages can be sent to your provider as well.   To learn more about what you can do with MyChart, go to [email or web address].    Your next appointment:   4-6 month(s)  The format for your next appointment:   In Person  Provider:   Skeet Latch, MD    Other Instructions  To prevent or reduce lower extremity swelling: Eat a low salt diet. Salt makes the body hold onto extra fluid  which causes swelling. Sit with legs elevated. For example, in the recliner or on an [place].  Wear knee-high compression stockings during the daytime. Ones labeled 15-20 mmHg provide good compression.  Heart Healthy Diet Recommendations: A low-salt diet is recommended. Meats should be grilled, baked, or boiled. Avoid fried foods. Focus on lean protein sources like fish or chicken with vegetables and fruits. The American Heart Association is a Microbiologist!  American Heart Association Diet and Lifeystyle Recommendations   Exercise recommendations: The American Heart Association recommends 150 minutes of moderate intensity exercise weekly. Try 30 minutes of moderate intensity exercise 4-5 times per week. This could include walking, jogging, or swimming.  For coronary artery disease often called "heart disease" we aim for optimal guideline directed medical therapy. We use the "A, B, C"s to help keep [place] on track!  A = Aspirin '81mg'$  daily B = Blood pressure control C = Cholesterol control. You take Rosuvastatin to help control your cholesterol.          Signed, Finis Bud, NP  08/26/2021 8:23 PM    [place]

## 2021-08-26 ENCOUNTER — Encounter (HOSPITAL_BASED_OUTPATIENT_CLINIC_OR_DEPARTMENT_OTHER): Payer: Self-pay | Admitting: Nurse Practitioner

## 2021-08-26 ENCOUNTER — Ambulatory Visit (INDEPENDENT_AMBULATORY_CARE_PROVIDER_SITE_OTHER): Payer: BC Managed Care – PPO | Admitting: Nurse Practitioner

## 2021-08-26 VITALS — BP 129/75 | HR 71 | Ht 66.0 in | Wt 131.2 lb

## 2021-08-26 DIAGNOSIS — R5383 Other fatigue: Secondary | ICD-10-CM

## 2021-08-26 DIAGNOSIS — R600 Localized edema: Secondary | ICD-10-CM | POA: Diagnosis not present

## 2021-08-26 DIAGNOSIS — I2510 Atherosclerotic heart disease of native coronary artery without angina pectoris: Secondary | ICD-10-CM | POA: Diagnosis not present

## 2021-08-26 DIAGNOSIS — I6523 Occlusion and stenosis of bilateral carotid arteries: Secondary | ICD-10-CM

## 2021-08-26 DIAGNOSIS — I2584 Coronary atherosclerosis due to calcified coronary lesion: Secondary | ICD-10-CM

## 2021-08-26 DIAGNOSIS — I10 Essential (primary) hypertension: Secondary | ICD-10-CM | POA: Diagnosis not present

## 2021-08-26 NOTE — Patient Instructions (Addendum)
Medication Instructions:  Continue your current medications.   *If you need a refill on your cardiac medications before your next appointment, please call your pharmacy*   Lab Work: Your physician recommends that you return for lab work in 2 months for CBC, TSH, lipid panel, CMP, magnesium  If you have labs (blood work) drawn today and your tests are completely normal, you will receive your results only by: [place] (if you have MyChart) OR A paper copy in the mail If you have any lab test that is abnormal or we need to change your treatment, we will call you to review the results.   Testing/Procedures: None ordered today.   Follow-Up: At [facility], you and your health needs are our priority.  As part of our continuing mission to provide you with exceptional heart care, we have created designated Provider Care Teams.  These Care Teams include your primary Cardiologist (physician) and Advanced Practice Providers (APPs -  Physician Assistants and Nurse Practitioners) who all work together to provide you with the care you need, when you need it.  We recommend signing up for the patient portal called "MyChart".  Sign up information is provided on this After Visit Summary.  MyChart is used to connect with patients for Virtual Visits (Telemedicine).  Patients are able to view lab/test results, encounter notes, upcoming appointments, etc.  Non-urgent messages can be sent to your provider as well.   To learn more about what you can do with MyChart, go to [email or web address].    Your next appointment:   4-6 month(s)  The format for your next appointment:   In Person  Provider:   Skeet Latch, MD    Other Instructions  To prevent or reduce lower extremity swelling: Eat a low salt diet. Salt makes the body hold onto extra fluid which causes swelling. Sit with legs elevated. For example, in the recliner or on an [place].  Wear knee-high compression stockings during the  daytime. Ones labeled 15-20 mmHg provide good compression.  Heart Healthy Diet Recommendations: A low-salt diet is recommended. Meats should be grilled, baked, or boiled. Avoid fried foods. Focus on lean protein sources like fish or chicken with vegetables and fruits. The American Heart Association is a Microbiologist!  American Heart Association Diet and Lifeystyle Recommendations   Exercise recommendations: The American Heart Association recommends 150 minutes of moderate intensity exercise weekly. Try 30 minutes of moderate intensity exercise 4-5 times per week. This could include walking, jogging, or swimming.  For coronary artery disease often called "heart disease" we aim for optimal guideline directed medical therapy. We use the "A, B, C"s to help keep [place] on track!  A = Aspirin '81mg'$  daily B = Blood pressure control C = Cholesterol control. You take Rosuvastatin to help control your cholesterol.

## 2021-08-30 ENCOUNTER — Encounter (HOSPITAL_BASED_OUTPATIENT_CLINIC_OR_DEPARTMENT_OTHER): Payer: Self-pay

## 2021-08-30 NOTE — Telephone Encounter (Signed)
Ok to order carotid?

## 2021-10-04 DIAGNOSIS — I10 Essential (primary) hypertension: Secondary | ICD-10-CM | POA: Diagnosis not present

## 2021-10-04 DIAGNOSIS — R5383 Other fatigue: Secondary | ICD-10-CM | POA: Diagnosis not present

## 2021-10-04 DIAGNOSIS — I6523 Occlusion and stenosis of bilateral carotid arteries: Secondary | ICD-10-CM | POA: Diagnosis not present

## 2021-10-04 DIAGNOSIS — I2510 Atherosclerotic heart disease of native coronary artery without angina pectoris: Secondary | ICD-10-CM | POA: Diagnosis not present

## 2021-10-04 DIAGNOSIS — I2584 Coronary atherosclerosis due to calcified coronary lesion: Secondary | ICD-10-CM | POA: Diagnosis not present

## 2021-10-05 ENCOUNTER — Telehealth (HOSPITAL_BASED_OUTPATIENT_CLINIC_OR_DEPARTMENT_OTHER): Payer: Self-pay

## 2021-10-05 DIAGNOSIS — E785 Hyperlipidemia, unspecified: Secondary | ICD-10-CM

## 2021-10-05 LAB — CBC
Hematocrit: 42.8 % (ref 34.0–46.6)
Hemoglobin: 14.1 g/dL (ref 11.1–15.9)
MCH: 29.9 pg (ref 26.6–33.0)
MCHC: 32.9 g/dL (ref 31.5–35.7)
MCV: 91 fL (ref 79–97)
Platelets: 227 10*3/uL (ref 150–450)
RBC: 4.72 x10E6/uL (ref 3.77–5.28)
RDW: 13 % (ref 11.7–15.4)
WBC: 4.9 10*3/uL (ref 3.4–10.8)

## 2021-10-05 LAB — COMPREHENSIVE METABOLIC PANEL
ALT: 20 IU/L (ref 0–32)
AST: 35 IU/L (ref 0–40)
Albumin/Globulin Ratio: 2.3 — ABNORMAL HIGH (ref 1.2–2.2)
Albumin: 4.6 g/dL (ref 3.9–4.9)
Alkaline Phosphatase: 77 IU/L (ref 44–121)
BUN/Creatinine Ratio: 12 (ref 12–28)
BUN: 9 mg/dL (ref 8–27)
Bilirubin Total: 0.8 mg/dL (ref 0.0–1.2)
CO2: 24 mmol/L (ref 20–29)
Calcium: 9.6 mg/dL (ref 8.7–10.3)
Chloride: 104 mmol/L (ref 96–106)
Creatinine, Ser: 0.73 mg/dL (ref 0.57–1.00)
Globulin, Total: 2 g/dL (ref 1.5–4.5)
Glucose: 87 mg/dL (ref 70–99)
Potassium: 4.1 mmol/L (ref 3.5–5.2)
Sodium: 143 mmol/L (ref 134–144)
Total Protein: 6.6 g/dL (ref 6.0–8.5)
eGFR: 93 mL/min/{1.73_m2} (ref >59)

## 2021-10-05 LAB — THYROID PANEL WITH TSH
Free Thyroxine Index: 1.6 (ref 1.2–4.9)
T3 Uptake Ratio: 27 % (ref 24–39)
T4, Total: 5.9 ug/dL (ref 4.5–12.0)
TSH: 1.03 u[IU]/mL (ref 0.450–4.500)

## 2021-10-05 LAB — LIPID PANEL
Chol/HDL Ratio: 2.4 ratio (ref 0.0–4.4)
Cholesterol, Total: 154 mg/dL (ref 100–199)
HDL: 65 mg/dL (ref >39)
LDL Chol Calc (NIH): 75 mg/dL (ref 0–99)
Triglycerides: 72 mg/dL (ref 0–149)
VLDL Cholesterol Cal: 14 mg/dL (ref 5–40)

## 2021-10-05 LAB — MAGNESIUM: Magnesium: 2.1 mg/dL (ref 1.6–2.3)

## 2021-10-05 MED ORDER — ROSUVASTATIN CALCIUM 20 MG PO TABS: 20.0000 mg | ORAL_TABLET | Freq: Every day | ORAL | 3 refills | Status: DC

## 2021-10-05 NOTE — Addendum Note (Signed)
Addended by: Gerald Stabs on: 10/05/2021 12:56 PM   Modules accepted: Orders

## 2021-10-05 NOTE — Telephone Encounter (Signed)
Results called to patient who verbalizes understanding! Labs ordered and mailed, prescriptions updated and sent to pharmacy on file.    ----- Message from Loel Dubonnet, NP sent at 10/05/2021  7:43 AM EDT ----- Normal kidney, liver, electrolytes.  CBC no anemia nor infection.  Normal thyroid.  Cholesterol with LDL (bad cholesterol) 75 which is not at goal of less than 70.   Please ensure fasting for lipid panel.  If not fasting, repeat fasting.  If she was fasting, increase Crestor to 20 mg daily with FLP/LFT in 2 months.

## 2021-10-05 NOTE — Telephone Encounter (Addendum)
Left message for patient to call back   ----- Message from Loel Dubonnet, NP sent at 10/05/2021  7:43 AM EDT ----- Normal kidney, liver, electrolytes.  CBC no anemia nor infection.  Normal thyroid.  Cholesterol with LDL (bad cholesterol) 75 which is not at goal of less than 70.  Please ensure fasting for lipid panel.  If not fasting, repeat fasting.  If she was fasting, increase Crestor to 20 mg daily with FLP/LFT in 2 months.

## 2021-11-10 ENCOUNTER — Ambulatory Visit: Payer: BC Managed Care – PPO | Admitting: Nurse Practitioner

## 2021-12-27 ENCOUNTER — Ambulatory Visit (HOSPITAL_BASED_OUTPATIENT_CLINIC_OR_DEPARTMENT_OTHER): Payer: BC Managed Care – PPO | Admitting: Cardiovascular Disease

## 2022-02-11 ENCOUNTER — Other Ambulatory Visit: Payer: Self-pay | Admitting: Family Medicine

## 2022-02-23 DIAGNOSIS — L720 Epidermal cyst: Secondary | ICD-10-CM | POA: Diagnosis not present

## 2022-02-23 DIAGNOSIS — L570 Actinic keratosis: Secondary | ICD-10-CM | POA: Diagnosis not present

## 2022-02-23 DIAGNOSIS — D225 Melanocytic nevi of trunk: Secondary | ICD-10-CM | POA: Diagnosis not present

## 2022-02-23 DIAGNOSIS — D2271 Melanocytic nevi of right lower limb, including hip: Secondary | ICD-10-CM | POA: Diagnosis not present

## 2022-02-23 DIAGNOSIS — L821 Other seborrheic keratosis: Secondary | ICD-10-CM | POA: Diagnosis not present

## 2022-02-28 ENCOUNTER — Other Ambulatory Visit: Payer: Self-pay | Admitting: Family Medicine

## 2022-02-28 DIAGNOSIS — R921 Mammographic calcification found on diagnostic imaging of breast: Secondary | ICD-10-CM

## 2022-04-20 ENCOUNTER — Ambulatory Visit (INDEPENDENT_AMBULATORY_CARE_PROVIDER_SITE_OTHER): Payer: BC Managed Care – PPO | Admitting: Cardiovascular Disease

## 2022-04-20 ENCOUNTER — Encounter (HOSPITAL_BASED_OUTPATIENT_CLINIC_OR_DEPARTMENT_OTHER): Payer: Self-pay | Admitting: Cardiovascular Disease

## 2022-04-20 VITALS — BP 132/78 | HR 72 | Ht 66.0 in | Wt 129.8 lb

## 2022-04-20 DIAGNOSIS — I6523 Occlusion and stenosis of bilateral carotid arteries: Secondary | ICD-10-CM

## 2022-04-20 DIAGNOSIS — I10 Essential (primary) hypertension: Secondary | ICD-10-CM

## 2022-04-20 DIAGNOSIS — E7800 Pure hypercholesterolemia, unspecified: Secondary | ICD-10-CM

## 2022-04-20 HISTORY — DX: Pure hypercholesterolemia, unspecified: E78.00

## 2022-04-20 NOTE — Assessment & Plan Note (Signed)
Blood pressure well-controlled on amlodipine.  Her edema has resolved.

## 2022-04-20 NOTE — Assessment & Plan Note (Addendum)
She has myalgias since increasing rosuvastatin.  LDL still is not at goal.  She will hold it for two weeks.  If her symptoms improve, start atorvastatin 40mg  and repeat labs in 2-3 months.  If she doesn't tolerate, consider PCSK9 inhibitor.

## 2022-04-20 NOTE — Progress Notes (Signed)
Cardiology Office Note   Date:  04/20/2022   ID:  Brittany Graham, Bess [birth year], MRN [id number]  PCP:  Brittany Dials, PA-C  Cardiologist:   Brittany Latch, MD   No chief complaint on file.    History of Present Illness: Brittany Graham is a 62 y.o. female with hypertension, mild carotid stenosis, and family history of premature CAD here for follow-up  Brittany Graham reports first being diagnosed with hypertension in 2012.  She also had preeclampsia when pregnant.  He has previously taken Bystolic and HCTZ/lisinopril and remembers feeling poorly on Bystolic.  It was felt that her blood pressure was elevated due to a perimenopausal state and she subsequently stopped taking any medication.  Her blood pressure was okay for 2-3 years but then started to increase.  She was started on HCTZ/lisinopril but was only taking half the prescribed dose. This was subsequently reduced to lisinopril 10 mg daily. She followed up with our pharmacist 10/2015 and her BP was well-controlled.  Brittany Graham sees an integrative medicine physician in [place]. She recommends that patient avoid having a mammogram every year, but rather alternate them with a thermogram every other year.  Brittany Graham had her thermogram, which showed some inflammation in her right carotid artery. She was then referred for carotid Dopplers 03/2015 that revealed mild bilateral carotid stenosis. She was started on aspirin 81 mg daily.   07/14/2021 lisinopril was switched to losartan. Her blood pressure remained uncontrolled and she felt depressed and irritable on losartan she then switched back to lisinopril. 08/26/2021 she visited Brittany Bud, NP complaining of fatigue since starting amlodipine and lower extremity swelling after walking. Her blood pressures had improved and were well controlled at the time. Rosuvastatin was increased 09/2021.  Today, she states that she has been doing well. She complains of myalgias with rosuvastatin. Her LDL  also had not changed after increasing her dose 09/2021. She has been staying active and walks three or four days a week, she also has bee playing pickle ball and notes no cardiac symptoms or limitations. Her blood pressures at home are usually around 120-125/ 72-74, it is a little higher in clinic today at 132/78.  She denies any palpitations, chest pain, shortness of breath, or peripheral edema. No lightheadedness, headaches, syncope, orthopnea, or PND.  Prior antihypertensives:  Lisinopril-cough Losartan-depression HCTZ-jittery Nebivolol-jittery   Past Medical History:  Diagnosis Date   Carotid stenosis 10/25/2015   Hematuria, microscopic    HISTORY OF   Hypertension 2008   ALSO,HX OF PREG INDUCED HYPERTENSION   Osteopenia 10/2018   T score -1.5 FRAX 6.7% / 0.5%   Pure hypercholesterolemia 04/20/2022   Spontaneous abortion    X 3    Past Surgical History:  Procedure Laterality Date   DILATION AND CURETTAGE OF UTERUS       Current Outpatient Medications  Medication Sig Dispense Refill   amLODipine (NORVASC) 5 MG tablet Take 1 tablet (5 mg total) by mouth daily. 30 tablet 11   aspirin EC 81 MG tablet Take 1 tablet (81 mg total) by mouth daily. 90 tablet 3   Cholecalciferol 5000 units TABS Take by mouth every other day.     fish oil-omega-3 fatty acids 1000 MG capsule Take 1 g by mouth daily.     MAGNESIUM MALATE PO Take by mouth.     Multiple Vitamin (MULTIVITAMIN PO) Take by mouth.       NP THYROID 60 MG tablet Take 60 mg by mouth  every morning.     rosuvastatin (CRESTOR) 20 MG tablet Take 1 tablet (20 mg total) by mouth daily. 90 tablet 3   vitamin k 100 MCG tablet Take 100 mcg by mouth daily. She reports taking 1 capsule daily.     No current facility-administered medications for this visit.    Allergies:   Codeine, Losartan, and Pindolol    Social History:  The patient  reports that she has never smoked. She has never used smokeless tobacco. She reports current  alcohol use. She reports that she does not use drugs.   Family History:  The patient's family history includes CAD in her father; CAD (age of onset: 20) in her sister; CAD (age of onset: 64) in her sister; Cancer (age of onset: 57) in her maternal grandmother; Colon polyps in her sister and sister; Dementia in her mother; Heart attack in her sister and sister; Heart disease in her father; Hypertension in her father and mother.    ROS:   Please see the history of present illness. (+)Myalgias All other systems are reviewed and negative.     PHYSICAL EXAM: VS:  BP 132/78   Pulse 72   Ht 5\' 6"  (1.676 m)   Wt 129 lb 12.8 oz (58.9 kg)   LMP 09/12/2017   BMI 20.95 kg/m  , BMI Body mass index is 20.95 kg/m. GENERAL:  Well appearing HEENT: Pupils equal round and reactive, fundi not visualized, oral mucosa unremarkable NECK:  No jugular venous distention, waveform within normal limits, carotid upstroke brisk and symmetric, no bruits, no thyromegaly LUNGS:  Clear to auscultation bilaterally HEART:  RRR.  PMI not displaced or sustained,S1 and S2 within normal limits, no S3, no S4, no clicks, no rubs, no murmurs ABD:  Flat, positive bowel sounds normal in frequency in pitch, no bruits, no rebound, no guarding, no midline pulsatile mass, no hepatomegaly, no splenomegaly EXT:  2 plus pulses throughout, no edema, no cyanosis no clubbing SKIN:  No rashes no nodules NEURO:  Cranial nerves II through XII grossly intact, motor grossly intact throughout PSYCH:  Cognitively intact, oriented to person place and time  EKG: EKG is personally reviewed.  04/20/2022: The EKG was not ordered. 6/15/2023Rate 72. Sinus Rhythm. 10/25/15: sinus rhythm rate 70 bpm.   10/18/16: Sinus rhythm. Rate 71 bpm.    Carotid Dopplers 04/02/15:1-39% ICA stenosis bilaterally.  Recent Labs: 10/04/2021: ALT 20; BUN 9; Creatinine, Ser 0.73; Hemoglobin 14.1; Magnesium 2.1; Platelets 227; Potassium 4.1; Sodium 143; TSH 1.030   Echo  01/2021, with LVEF 50-55%, and normal diastolic function.   Echo (at [facility]) 02/22/2021: Left Ventricle: Systolic function is low normal. EF: 50-55%.  Quantitative analysis of left ventricular Global Longitudinal Strain (GLS)  imaging is -16.300%.    Left Ventricle: Left ventricle size is normal.    Left Ventricle: No regional wall motion abnormalities noted.    Left Ventricle: Doppler parameters indicate normal diastolic function.    Left Atrium: Left atrium size is normal. Left atrium volume index is  normal (16-34 mL/m2).   VAS Carotid Duplex Bilateral 11/08/2016: Final Interpretation:  Right Carotid: Essentially stable 1-39% RICA stenosis. The extracranial vessels were near-normal with only minimal wall thickening or plaque.  RICA velocities remain within normal range and have  decreased compared to the prior exam.   Left Carotid: Essentially stable 123456 LICA stenosis. The extracranial vessels were near-normal with only minimal wall thickening or  plaque. LICA velocities remain within normal range and have decreased compared to  the prior exam.   Vertebrals:  Both vertebral arteries were patent with antegrade flow.  Subclavians: Normal flow hemodynamics were seen in bilateral subclavian arteries.    Lipid Panel    Component Value Date/Time   CHOL 154 10/04/2021 0833   TRIG 72 10/04/2021 0833   HDL 65 10/04/2021 0833   CHOLHDL 2.4 10/04/2021 0833   CHOLHDL 2.7 03/30/2015 0836   VLDL 13 03/30/2015 0836   LDLCALC 75 10/04/2021 0833      Wt Readings from Last 3 Encounters:  04/20/22 129 lb 12.8 oz (58.9 kg)  08/26/21 131 lb 3.2 oz (59.5 kg)  07/14/21 130 lb 8 oz (59.2 kg)      ASSESSMENT AND PLAN:  HYPERTENSION, BENIGN Blood pressure well-controlled on amlodipine.  Her edema has resolved.   Carotid stenosis Minimal plaque in 2018.  Will repeat carotid Dopplers.  Pure hypercholesterolemia She has myalgias since increasing rosuvastatin.  LDL still is not at  goal.  She will hold it for two weeks.  If her symptoms improve, start atorvastatin 40mg  and repeat labs in 2-3 months.  If she doesn't tolerate, consider PCSK9 inhibitor.   Current medicines are reviewed at length with the patient today.  The patient does not have concerns regarding medicines.  The following changes have been made: stop lisinopril and start amlodipine Labs/ tests ordered today include:   Orders Placed This Encounter  Procedures   VAS US CAROTID   Disposition: FU with Catera Hankins C. Oval Linsey, MD, [facility] in 1 year.  This note was written with the assistance of speech recognition software.  Please excuse any transcriptional errors.  I,Coren O'Brien,acting as a Education administrator for [place], MD.,have documented all relevant documentation on the behalf of Brittany Latch, MD,as directed by  Brittany Latch, MD while in the presence of Brittany Latch, MD.  I, [place] Oval Linsey, MD have reviewed all documentation for this visit.  The documentation of the exam, diagnosis, procedures, and orders on 04/20/2022 are all accurate and complete.

## 2022-04-20 NOTE — Assessment & Plan Note (Signed)
Minimal plaque in 2018.  Will repeat carotid Dopplers.

## 2022-04-20 NOTE — Patient Instructions (Signed)
Medication Instructions:  HOLD YOUR ROSUVASTATIN FOR 2 WEEKS. CALL THE OFFICE WITH UPDATE ON HOW YOU ARE DOING OFF   *If you need a refill on your cardiac medications before your next appointment, please call your pharmacy*  Lab Work: NONE  Testing/Procedures: Your physician has requested that you have a carotid duplex. This test is an ultrasound of the carotid arteries in your neck. It looks at blood flow through these arteries that supply the brain with blood. Allow one hour for this exam. There are no restrictions or special instructions.  Follow-Up: At [facility], you and your health needs are our priority.  As part of our continuing mission to provide you with exceptional heart care, we have created designated Provider Care Teams.  These Care Teams include your primary Cardiologist (physician) and Advanced Practice Providers (APPs -  Physician Assistants and Nurse Practitioners) who all work together to provide you with the care you need, when you need it.  We recommend signing up for the patient portal called "MyChart".  Sign up information is provided on this After Visit Summary.  MyChart is used to connect with patients for Virtual Visits (Telemedicine).  Patients are able to view lab/test results, encounter notes, upcoming appointments, etc.  Non-urgent messages can be sent to your provider as well.   To learn more about what you can do with MyChart, go to [email or web address].    Your next appointment:   12 month(s)  Provider:   Skeet Latch, MD

## 2022-05-18 ENCOUNTER — Ambulatory Visit (INDEPENDENT_AMBULATORY_CARE_PROVIDER_SITE_OTHER): Payer: BC Managed Care – PPO

## 2022-05-18 DIAGNOSIS — I6523 Occlusion and stenosis of bilateral carotid arteries: Secondary | ICD-10-CM | POA: Diagnosis not present

## 2022-05-30 DIAGNOSIS — I10 Essential (primary) hypertension: Secondary | ICD-10-CM | POA: Diagnosis not present

## 2022-06-03 ENCOUNTER — Encounter (HOSPITAL_BASED_OUTPATIENT_CLINIC_OR_DEPARTMENT_OTHER): Payer: Self-pay | Admitting: Emergency Medicine

## 2022-06-03 ENCOUNTER — Emergency Department (HOSPITAL_BASED_OUTPATIENT_CLINIC_OR_DEPARTMENT_OTHER)
Admission: EM | Admit: 2022-06-03 | Discharge: 2022-06-03 | Disposition: A | Discharge status: Home / Self Care | Payer: BC Managed Care – PPO | Attending: Emergency Medicine | Admitting: Emergency Medicine

## 2022-06-03 DIAGNOSIS — T467X5A Adverse effect of peripheral vasodilators, initial encounter: Secondary | ICD-10-CM | POA: Diagnosis not present

## 2022-06-03 DIAGNOSIS — Z79899 Other long term (current) drug therapy: Secondary | ICD-10-CM | POA: Diagnosis not present

## 2022-06-03 DIAGNOSIS — I10 Essential (primary) hypertension: Secondary | ICD-10-CM | POA: Insufficient documentation

## 2022-06-03 DIAGNOSIS — R21 Rash and other nonspecific skin eruption: Secondary | ICD-10-CM | POA: Insufficient documentation

## 2022-06-03 DIAGNOSIS — Z7982 Long term (current) use of aspirin: Secondary | ICD-10-CM | POA: Insufficient documentation

## 2022-06-03 NOTE — ED Provider Notes (Signed)
[facility] Provider Note: Brittany Dell, MD, FACEP  CSN: [id number] MRN: [id number] ARRIVAL: 06/03/22 at 2103 ROOM: DB009/DB009   CHIEF COMPLAINT  Rash   HISTORY OF PRESENT ILLNESS  06/03/22 10:40 PM Brittany Graham is a 62 y.o. female who started taking niacin 4 days ago.  She ate dinner about 8:30 PM and after dinner took her niacin.  About 45 minutes later she developed a rash.  The rash was not raised and was somewhat confluent affecting mostly her face, neck and chest but also involves her legs.  The rash on her legs was itchy.  She had no throat swelling or difficulty breathing.  She had no gastrointestinal symptoms.  Her symptoms have subsequently resolved without intervention.   Past Medical History:  Diagnosis Date   Carotid stenosis 10/25/2015   Hematuria, microscopic    HISTORY OF   Hypertension 2008   ALSO,HX OF PREG INDUCED HYPERTENSION   Osteopenia 10/2018   T score -1.5 FRAX 6.7% / 0.5%   Pure hypercholesterolemia 04/20/2022   Spontaneous abortion    X 3    Past Surgical History:  Procedure Laterality Date   DILATION AND CURETTAGE OF UTERUS      Family History  Problem Relation Age of Onset   Hypertension Mother    Dementia Mother        DIED April 05, 2022   Hypertension Father    Heart disease Father    CAD Father    Colon polyps Sister    CAD Sister 37   Colon polyps Sister    CAD Sister 67   Heart attack Sister        stents placed   Heart attack Sister        stents placed   Cancer Maternal Grandmother 25       COLON    Social History   Tobacco Use   Smoking status: Never   Smokeless tobacco: Never  Vaping Use   Vaping Use: Never used  Substance Use Topics   Alcohol use: Yes    Alcohol/week: 0.0 standard drinks of alcohol    Comment: occ. wine   Drug use: No    Prior to Admission medications   Medication Sig Start Date End Date Taking? Authorizing Provider  amLODipine (NORVASC) 5 MG tablet Take 1 tablet (5 mg total) by mouth  daily. 07/14/21 07/09/22  Chilton Si, MD  aspirin EC 81 MG tablet Take 1 tablet (81 mg total) by mouth daily. 04/05/15   Chilton Si, MD  Cholecalciferol 5000 units TABS Take by mouth every other day.    [provider]  fish oil-omega-3 fatty acids 1000 MG capsule Take 1 g by mouth daily.    [provider]  MAGNESIUM MALATE PO Take by mouth.    [provider]  Multiple Vitamin (MULTIVITAMIN PO) Take by mouth.      [provider]  NP THYROID 60 MG tablet Take 60 mg by mouth every morning. 09/01/20   [provider]  rosuvastatin (CRESTOR) 20 MG tablet Take 1 tablet (20 mg total) by mouth daily. 10/05/21   Alver Sorrow, NP  vitamin k 100 MCG tablet Take 100 mcg by mouth daily. She reports taking 1 capsule daily.    [provider]    Allergies Codeine, Losartan, and Pindolol   REVIEW OF SYSTEMS  Negative except as noted here or in the History of Present Illness.   PHYSICAL EXAMINATION  Initial Vital Signs Blood pressure Marland Kitchen)  182/91, pulse 68, temperature 97.7 F (36.5 C), temperature source Oral, resp. rate 18, last menstrual period 09/12/2017, SpO2 96 %.  Examination General: Well-developed, well-nourished female in no acute distress; appearance consistent with age of record HENT: normocephalic; atraumatic Eyes: Normal appearance Neck: supple Heart: regular rate and rhythm Lungs: clear to auscultation bilaterally Abdomen: soft; nondistended; nontender; bowel sounds present Extremities: No deformity; full range of motion; pulses normal Neurologic: Awake, alert and oriented; motor function intact in all extremities and symmetric; no facial droop Skin: Warm and dry Psychiatric: Normal mood and affect   RESULTS  Summary of this visit's results, reviewed and interpreted by myself:   EKG Interpretation  Date/Time:    Ventricular Rate:    PR Interval:    QRS Duration:   QT Interval:    QTC Calculation:   R  Axis:     Text Interpretation:         Laboratory Studies: No results found for this or any previous visit (from the past 24 hour(s)). Imaging Studies: No results found.  ED COURSE and MDM  Nursing notes, initial and subsequent vitals signs, including pulse oximetry, reviewed and interpreted by myself.  Vitals:   06/03/22 2107 06/03/22 2215  BP: (!) 189/96 (!) 182/91  Pulse: 86 68  Resp: 17 18  Temp: 97.7 F (36.5 C)   TempSrc: Oral   SpO2: 100% 96%   Medications - No data to display  I suspect this represents a flushing reaction to the niacin although it is interesting that it had not happened with prior doses.  As is typical with niacin reactions it resolved on its own.  The patient was advised to discontinue the niacin pending discussion with her primary care physician.  PROCEDURES  Procedures   ED DIAGNOSES     ICD-10-CM   1. Adverse reaction to niacin, initial encounter  T46.7X5A          Gayland Nicol, Jonny Ruiz, MD 06/03/22 2251

## 2022-06-03 NOTE — ED Triage Notes (Signed)
Body rash and itching noticed after dinner. Denies any new food. Recently started taking niacin on Tuesday.  Denies swelling of throat or any problems breathing

## 2022-06-03 NOTE — Discharge Instructions (Addendum)
Your reaction was likely due to the niacin you took.  It is known to cause flushing and itching.  Please discontinue the niacin and to you can discuss this with your primary care physician.

## 2022-06-14 ENCOUNTER — Encounter (HOSPITAL_BASED_OUTPATIENT_CLINIC_OR_DEPARTMENT_OTHER): Payer: Self-pay | Admitting: Cardiovascular Disease

## 2022-06-14 DIAGNOSIS — E785 Hyperlipidemia, unspecified: Secondary | ICD-10-CM

## 2022-06-14 DIAGNOSIS — E7800 Pure hypercholesterolemia, unspecified: Secondary | ICD-10-CM

## 2022-06-15 MED ORDER — ATORVASTATIN CALCIUM 10 MG PO TABS: 10.0000 mg | ORAL_TABLET | Freq: Every day | ORAL | 0 refills | Status: DC

## 2022-06-15 NOTE — Telephone Encounter (Signed)
Please advise 

## 2022-06-15 NOTE — Telephone Encounter (Signed)
From Dr. Leonides Sake last note: "If her symptoms improve, start atorvastatin 40mg  and repeat labs in 2-3 months"  Alver Sorrow, NP

## 2022-06-15 NOTE — Telephone Encounter (Signed)
Okay to trial Atorvastatin 10mg  QD with labs in 2 mos.   Alver Sorrow, NP

## 2022-08-14 ENCOUNTER — Ambulatory Visit: Admission: RE | Admit: 2022-08-14 | Payer: BC Managed Care – PPO | Source: Ambulatory Visit

## 2022-08-14 ENCOUNTER — Other Ambulatory Visit (HOSPITAL_BASED_OUTPATIENT_CLINIC_OR_DEPARTMENT_OTHER): Payer: Self-pay | Admitting: Cardiovascular Disease

## 2022-08-14 DIAGNOSIS — R921 Mammographic calcification found on diagnostic imaging of breast: Secondary | ICD-10-CM

## 2022-10-10 DIAGNOSIS — N951 Menopausal and female climacteric states: Secondary | ICD-10-CM | POA: Diagnosis not present

## 2022-10-10 DIAGNOSIS — E559 Vitamin D deficiency, unspecified: Secondary | ICD-10-CM | POA: Diagnosis not present

## 2022-10-10 DIAGNOSIS — E611 Iron deficiency: Secondary | ICD-10-CM | POA: Diagnosis not present

## 2022-10-10 DIAGNOSIS — E785 Hyperlipidemia, unspecified: Secondary | ICD-10-CM | POA: Diagnosis not present

## 2022-10-10 DIAGNOSIS — Z131 Encounter for screening for diabetes mellitus: Secondary | ICD-10-CM | POA: Diagnosis not present

## 2022-10-10 DIAGNOSIS — R5383 Other fatigue: Secondary | ICD-10-CM | POA: Diagnosis not present

## 2022-10-10 DIAGNOSIS — E538 Deficiency of other specified B group vitamins: Secondary | ICD-10-CM | POA: Diagnosis not present

## 2022-10-10 DIAGNOSIS — I10 Essential (primary) hypertension: Secondary | ICD-10-CM | POA: Diagnosis not present

## 2022-10-10 DIAGNOSIS — D513 Other dietary vitamin B12 deficiency anemia: Secondary | ICD-10-CM | POA: Diagnosis not present

## 2022-10-10 DIAGNOSIS — E039 Hypothyroidism, unspecified: Secondary | ICD-10-CM | POA: Diagnosis not present

## 2022-11-21 DIAGNOSIS — E611 Iron deficiency: Secondary | ICD-10-CM | POA: Diagnosis not present

## 2022-11-21 DIAGNOSIS — R5383 Other fatigue: Secondary | ICD-10-CM | POA: Diagnosis not present

## 2022-11-21 DIAGNOSIS — E039 Hypothyroidism, unspecified: Secondary | ICD-10-CM | POA: Diagnosis not present

## 2022-11-21 DIAGNOSIS — N951 Menopausal and female climacteric states: Secondary | ICD-10-CM | POA: Diagnosis not present

## 2023-01-30 DIAGNOSIS — N951 Menopausal and female climacteric states: Secondary | ICD-10-CM | POA: Diagnosis not present

## 2023-01-30 DIAGNOSIS — E039 Hypothyroidism, unspecified: Secondary | ICD-10-CM | POA: Diagnosis not present

## 2023-01-30 DIAGNOSIS — I10 Essential (primary) hypertension: Secondary | ICD-10-CM | POA: Diagnosis not present

## 2023-01-30 DIAGNOSIS — E611 Iron deficiency: Secondary | ICD-10-CM | POA: Diagnosis not present

## 2023-01-30 DIAGNOSIS — E538 Deficiency of other specified B group vitamins: Secondary | ICD-10-CM | POA: Diagnosis not present

## 2023-01-30 DIAGNOSIS — R5383 Other fatigue: Secondary | ICD-10-CM | POA: Diagnosis not present

## 2023-01-30 DIAGNOSIS — E785 Hyperlipidemia, unspecified: Secondary | ICD-10-CM | POA: Diagnosis not present

## 2023-07-10 ENCOUNTER — Other Ambulatory Visit: Payer: Self-pay | Admitting: Family

## 2023-07-10 DIAGNOSIS — Z1231 Encounter for screening mammogram for malignant neoplasm of breast: Secondary | ICD-10-CM

## 2023-08-16 ENCOUNTER — Ambulatory Visit

## 2023-08-23 ENCOUNTER — Ambulatory Visit
Admission: RE | Admit: 2023-08-23 | Discharge: 2023-08-23 | Disposition: A | Discharge status: Home / Self Care | Source: Ambulatory Visit | Attending: Family | Admitting: Family

## 2023-08-23 DIAGNOSIS — Z1231 Encounter for screening mammogram for malignant neoplasm of breast: Secondary | ICD-10-CM

## 2023-10-04 ENCOUNTER — Telehealth: Payer: Self-pay | Admitting: Obstetrics and Gynecology

## 2023-10-04 DIAGNOSIS — E2839 Other primary ovarian failure: Secondary | ICD-10-CM

## 2023-10-04 NOTE — Telephone Encounter (Signed)
 Bone density

## 2023-10-09 ENCOUNTER — Other Ambulatory Visit (HOSPITAL_COMMUNITY)
Admission: RE | Admit: 2023-10-09 | Discharge: 2023-10-09 | Disposition: A | Discharge status: Home / Self Care | Source: Ambulatory Visit | Attending: Obstetrics and Gynecology | Admitting: Obstetrics and Gynecology

## 2023-10-09 ENCOUNTER — Ambulatory Visit (INDEPENDENT_AMBULATORY_CARE_PROVIDER_SITE_OTHER): Admitting: Obstetrics and Gynecology

## 2023-10-09 ENCOUNTER — Encounter: Payer: Self-pay | Admitting: Obstetrics and Gynecology

## 2023-10-09 VITALS — BP 128/70 | HR 72 | Ht 63.98 in | Wt 132.2 lb

## 2023-10-09 DIAGNOSIS — Z01419 Encounter for gynecological examination (general) (routine) without abnormal findings: Secondary | ICD-10-CM | POA: Insufficient documentation

## 2023-10-09 DIAGNOSIS — Z1331 Encounter for screening for depression: Secondary | ICD-10-CM | POA: Diagnosis not present

## 2023-10-09 DIAGNOSIS — E2839 Other primary ovarian failure: Secondary | ICD-10-CM

## 2023-10-09 NOTE — Progress Notes (Addendum)
 63 y.o. y.o. female here for annual exam. Patient's last menstrual period was 09/12/2017.   2 daughters, one lives with her no grand kids [phone] presents for annual exam. Postmenopausal  - stopped HRT last year and doing well without it, occasional hot flashes. Normal pap and mammogram history. HTN managed by cardiology.   Gynecologic History Contraception: post menopausal status  Health maintenance Last Pap: 10/15/2018. Results were: normal, 5-year repeat Last mammogram: 07/28/2020. Results were: Probable benign right breast calcifications, 75-month follow up recommended (scheduled 02/09/2021) Last colonoscopy: 2022. Results were: Normal, 10-year recall Last Dexa: 10/29/2018. Results were: t-score -1.2, FRAX 6.7% / 0.5%, to get repeat     10/09/2023    3:33 PM  Depression screen PHQ 2/9  Decreased Interest 0  Down, Depressed, Hopeless 0  PHQ - 2 Score 0    Body mass index is 22.71 kg/m.    Blood pressure 128/70, pulse 72, height 5' 3.98 (1.625 m), weight 132 lb 3.2 oz (60 kg), last menstrual period 09/12/2017, SpO2 95%.  No results found for: DIAGPAP, HPVHIGH, ADEQPAP  GYN HISTORY: No results found for: DIAGPAP, HPVHIGH, ADEQPAP  OB History  Gravida Para Term Preterm AB Living  5 1   4 1   SAB IAB Ectopic Multiple Live Births  4        # Outcome Date GA Lbr Len/2nd Weight Sex Type Anes PTL Lv  5 SAB           4 SAB           3 SAB           2 SAB           1 Para             Past Medical History:  Diagnosis Date   Carotid stenosis 10/25/2015   Hematuria, microscopic    HISTORY OF   Hypertension 2008   ALSO,HX OF PREG INDUCED HYPERTENSION   Osteopenia 10/2018   T score -1.5 FRAX 6.7% / 0.5%   Pure hypercholesterolemia 04/20/2022   Spontaneous abortion    X 3    Past Surgical History:  Procedure Laterality Date   DILATION AND CURETTAGE OF UTERUS      Current Outpatient Medications on File Prior to Visit  Medication Sig Dispense Refill    amLODipine  (NORVASC ) 5 MG tablet Take 1 tablet (5 mg total) by mouth daily. 30 tablet 11   Ascorbic Acid (VITAMIN C) 1000 MG tablet Take 1,000 mg by mouth daily.     Cholecalciferol 5000 units TABS Take by mouth every other day.     fish oil-omega-3 fatty acids 1000 MG capsule Take 1 g by mouth daily.     Magnesium 200 MG TABS 2 tablets with a meal Orally Once a day; Duration: 30 day(s)     MAGNESIUM MALATE PO Take by mouth.     Multiple Vitamin (MULTIVITAMIN PO) Take by mouth.       NP THYROID  60 MG tablet Take 60 mg by mouth every morning.     vitamin k 100 MCG tablet Take 100 mcg by mouth daily. She reports taking 1 capsule daily.     aspirin EC 81 MG tablet Take 1 tablet (81 mg total) by mouth daily. (Patient not taking: Reported on 10/09/2023) 90 tablet 3   No current facility-administered medications on file prior to visit.    Social History   Socioeconomic History   Marital status: Married    Spouse name:  Not on file   Number of children: Not on file   Years of education: Not on file   Highest education level: Not on file  Occupational History   Not on file  Tobacco Use   Smoking status: Never   Smokeless tobacco: Never  Vaping Use   Vaping status: Never Used  Substance and Sexual Activity   Alcohol use: Yes    Alcohol/week: 0.0 standard drinks of alcohol    Comment: occ. wine   Drug use: No   Sexual activity: Yes    Partners: Male    Birth control/protection: None    Comment: INTRECOURSE AGE 63, SEXUAL PARTNERS LESS THAN 5  Other Topics Concern   Not on file  Social History Narrative   Epworth Sleepiness Scale Score:  8      --I have HTN   --I have problems with memory/concentration   Social Drivers of Health   Financial Resource Strain: Low Risk  (07/14/2021)   Overall Financial Resource Strain (CARDIA)    Difficulty of Paying Living Expenses: Not hard at all  Food Insecurity: No Food Insecurity (07/14/2021)   Hunger Vital Sign    Worried About Running Out  of Food in the Last Year: Never true    Ran Out of Food in the Last Year: Never true  Transportation Needs: No Transportation Needs (07/14/2021)   PRAPARE - Administrator, Civil Service (Medical): No    Lack of Transportation (Non-Medical): No  Physical Activity: Sufficiently Active (07/14/2021)   Exercise Vital Sign    Days of Exercise per Week: 3 days    Minutes of Exercise per Session: 50 min  Stress: No Stress Concern Present (12/29/2020)   Received from [facility] Occupational Health - Occupational Stress Questionnaire    Feeling of Stress : Only a little  Social Connections: Unknown (06/02/2021)   [facility]   Social Network    Social Network: Not on file  Intimate Partner Violence: Unknown (05/04/2021)   Received from [facility]   HITS    Physically Hurt: Not on file    Insult or Talk Down To: Not on file    Threaten Physical Harm: Not on file    Scream or Curse: Not on file    Family History  Problem Relation Age of Onset   Hypertension Mother    Dementia Mother        DIED 2023-03-27   Hypertension Father    Heart disease Father    CAD Father    Colon polyps Sister    CAD Sister 62   Colon polyps Sister    CAD Sister 16   Heart attack Sister        stents placed   Heart attack Sister        stents placed   Breast cancer Sister 8   Cancer Maternal Grandmother 25       COLON     Allergies  Allergen Reactions   Codeine     REACTION: Headache   Losartan     depresion   Pindolol     Dizziness       Patient's last menstrual period was Patient's last menstrual period was 09/12/2017.Brittany Graham            Review of Systems Alls systems reviewed and are negative.     Physical Exam Constitutional:      Appearance: Normal appearance.  Genitourinary:     Vulva  and urethral meatus normal.     No lesions in the vagina.     Right Labia: No rash, lesions or skin changes.    Left Labia: No lesions, skin changes  or rash.    No vaginal discharge or tenderness.     No vaginal prolapse present.    No vaginal atrophy present.     Right Adnexa: not tender, not palpable and no mass present.    Left Adnexa: not tender, not palpable and no mass present.    No cervical motion tenderness or discharge.     Uterus is not enlarged, tender or irregular.  Breasts:    Right: Normal.     Left: Normal.  HENT:     Head: Normocephalic.  Neck:     Thyroid : No thyroid  mass, thyromegaly or thyroid  tenderness.  Cardiovascular:     Rate and Rhythm: Normal rate and regular rhythm.     Heart sounds: Normal heart sounds, S1 normal and S2 normal.  Pulmonary:     Effort: Pulmonary effort is normal.     Breath sounds: Normal breath sounds and air entry.  Abdominal:     General: There is no distension.     Palpations: Abdomen is soft. There is no mass.     Tenderness: There is no abdominal tenderness. There is no guarding or rebound.  Musculoskeletal:        General: Normal range of motion.     Cervical back: Full passive range of motion without pain, normal range of motion and neck supple. No tenderness.     Right lower leg: No edema.     Left lower leg: No edema.  Neurological:     Mental Status: She is alert.  Skin:    General: Skin is warm.  Psychiatric:        Mood and Affect: Mood normal.        Behavior: Behavior normal.        Thought Content: Thought content normal.  Vitals and nursing note reviewed. Exam conducted with a chaperone present.       A:         Well Woman GYN exam                             P:        Pap smear collected today Encouraged annual mammogram screening Colon cancer screening up-to-date DXA ordered today Labs and immunizations to do with PMD Discussed breast self exams Encouraged healthy lifestyle practices Encouraged Vit D and Calcium    No follow-ups on file.  Brittany Graham

## 2023-10-11 ENCOUNTER — Ambulatory Visit: Payer: Self-pay | Admitting: Obstetrics and Gynecology

## 2023-10-11 LAB — CYTOLOGY - PAP: Diagnosis: NEGATIVE

## 2023-10-13 IMAGING — MG MM DIGITAL DIAGNOSTIC UNILAT*R* W/ TOMO W/ CAD
9 series · 9 of 17 positions shown · non-contrast
Comparison: Previous exam(s).

CLINICAL DATA: 61-year-old female presenting for first six-month
follow-up of probably benign right breast calcifications.

EXAM:
DIGITAL DIAGNOSTIC UNILATERAL RIGHT MAMMOGRAM WITH TOMOSYNTHESIS AND
CAD
TECHNIQUE: Right digital diagnostic mammography and breast tomosynthesis was
performed. The images were evaluated with computer-aided detection.

[R ML (1 of 2)]
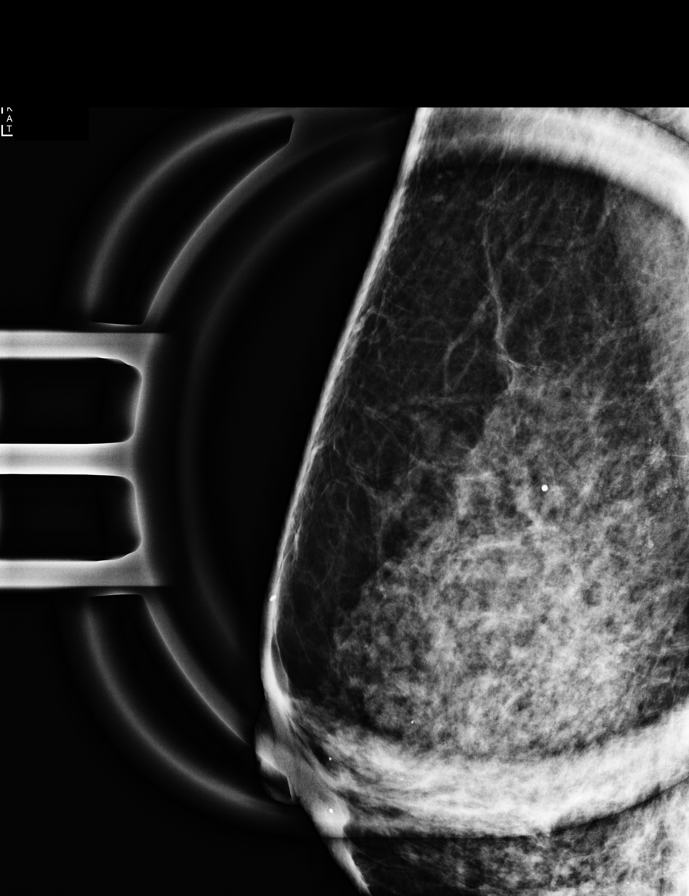

[R CC (1 of 3)]
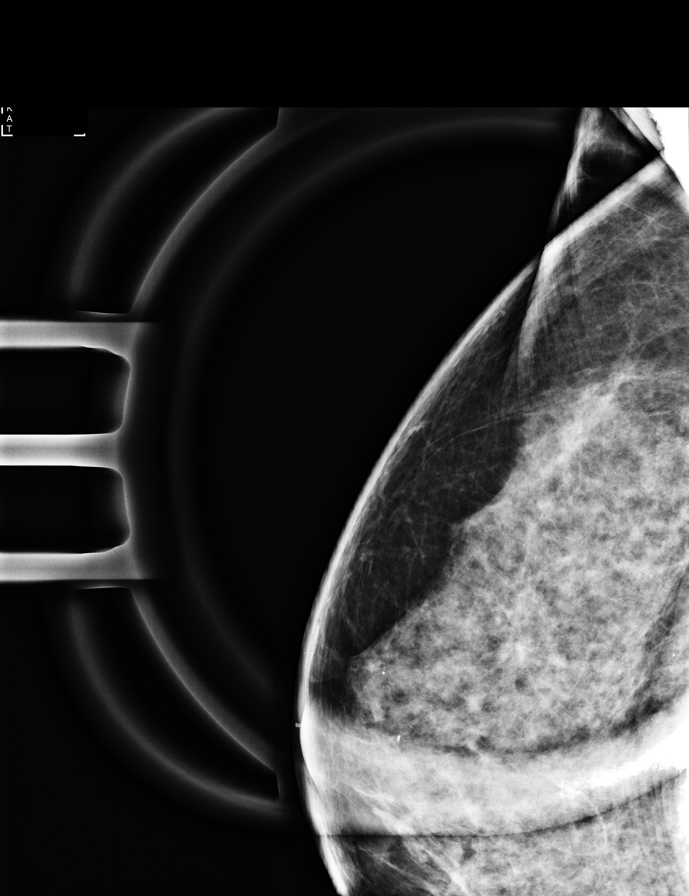

[R CC (2 of 3)]
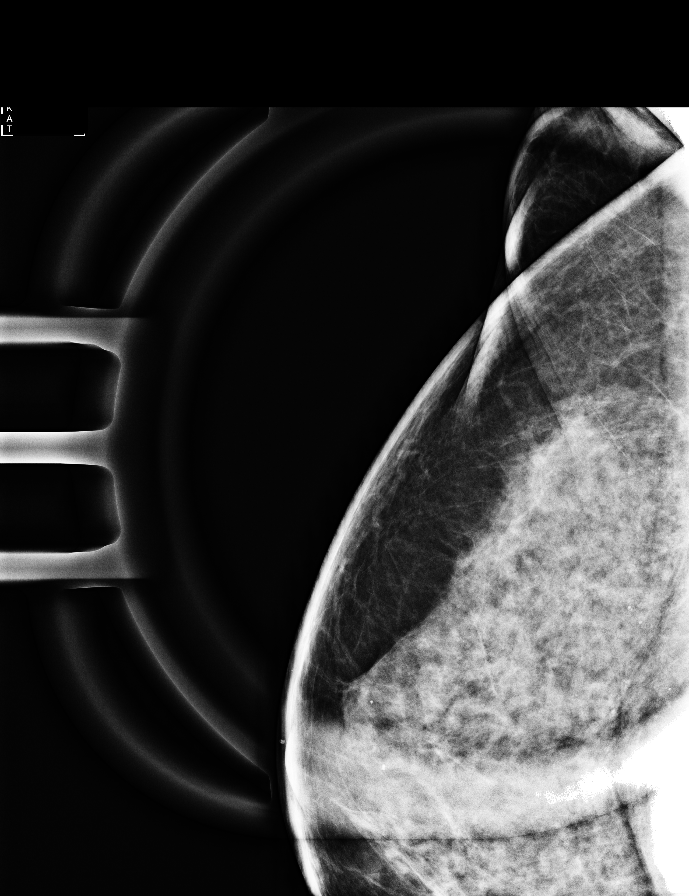

[R ML (2 of 2)]
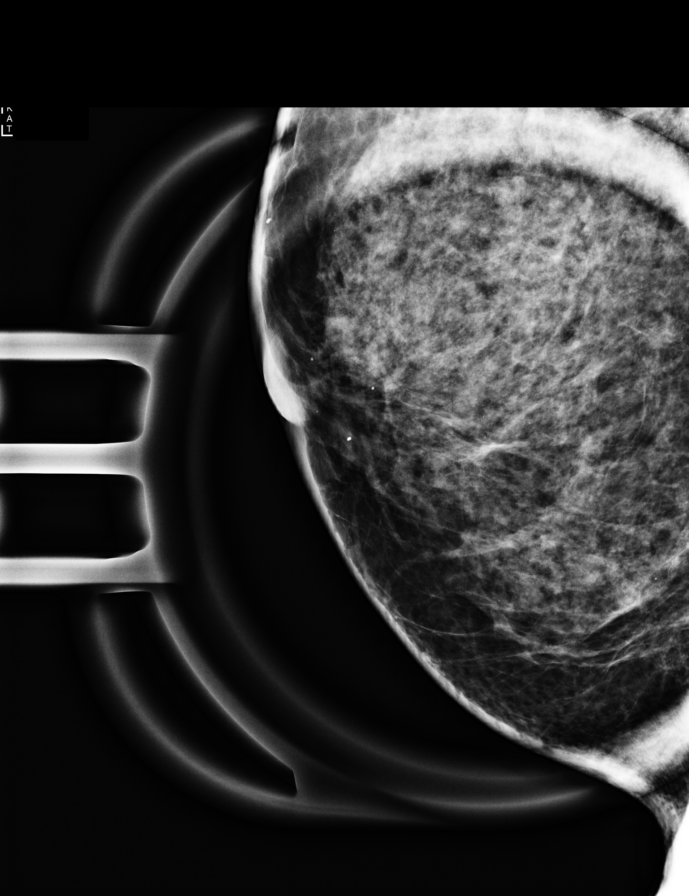

[R CC (3 of 3)]
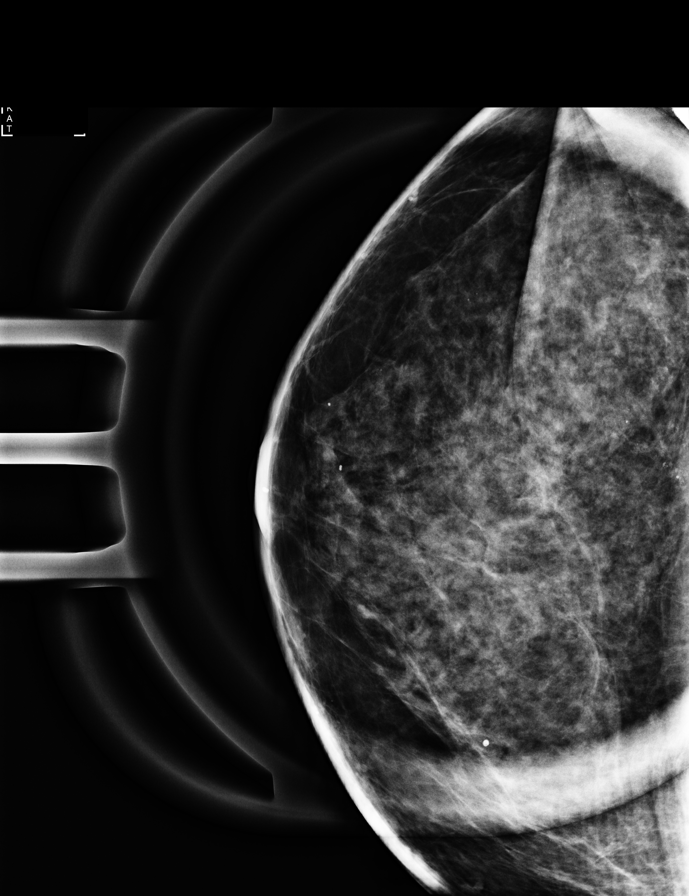

[R CC synth-2D]
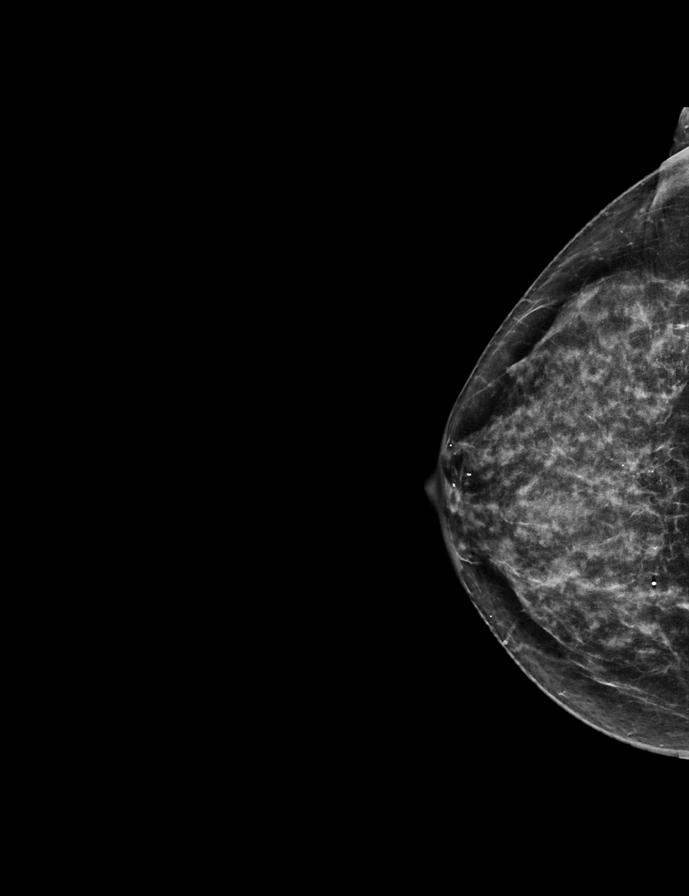

[R MLO synth-2D]
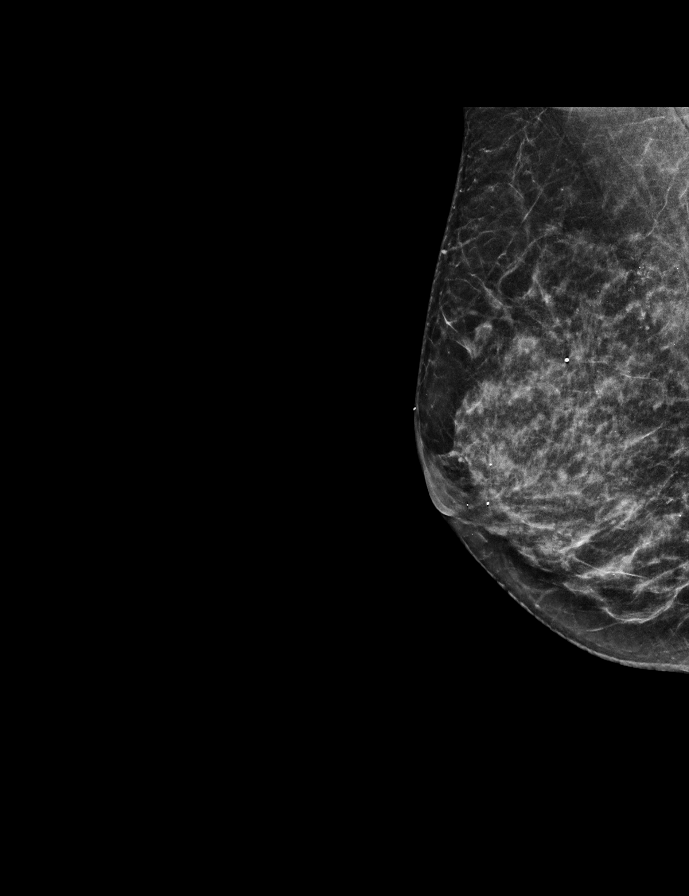

[R MLO tomo · tomo slice 27/53.0]
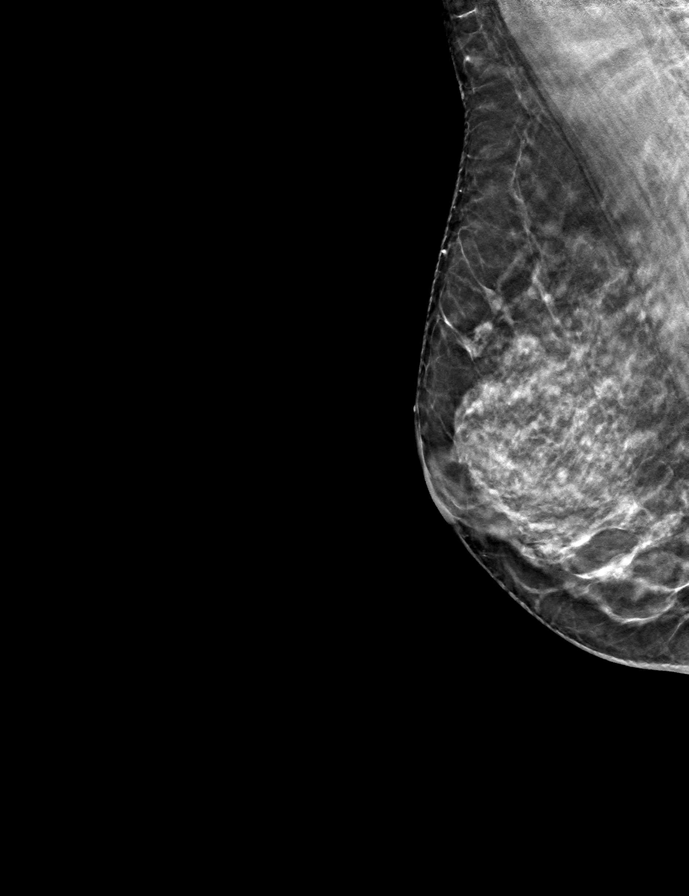

[R CC tomo · tomo slice 28/55.0]
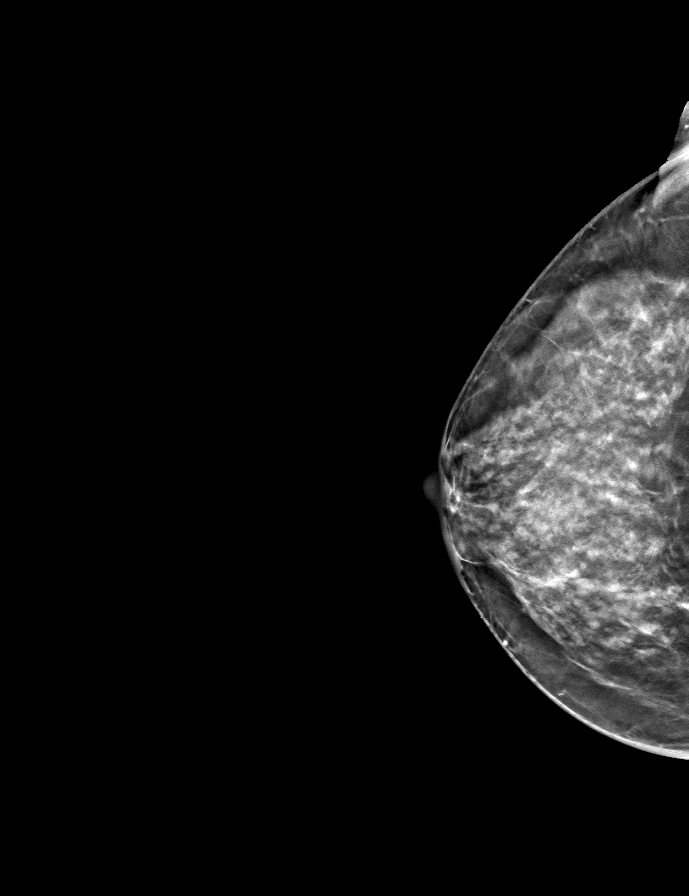

[9 of 17 positions shown; findings below may reference images not displayed]

ACR Breast Density Category c: The breast tissue is heterogeneously
dense, which may obscure small masses.
FINDINGS: Grouped calcifications in the upper-outer right breast are
mammographically stable. No new or suspicious findings identified.
IMPRESSION: Stable, probably benign right breast calcifications. Recommend
continued short-term follow-up.

RECOMMENDATION:
Bilateral diagnostic mammogram in 6 months.

I have discussed the findings and recommendations with the patient.
If applicable, a reminder letter will be sent to the patient
regarding the next appointment.

BI-RADS CATEGORY  3: Probably benign.
